# Patient Record
Sex: Female | Born: 1955
Health system: Southern US, Community
[De-identification: ages and names within clinical notes are randomized; demographics above are authoritative.]

## PROBLEM LIST (undated history)

## (undated) DIAGNOSIS — E785 Hyperlipidemia, unspecified: Secondary | ICD-10-CM

## (undated) DIAGNOSIS — E119 Type 2 diabetes mellitus without complications: Secondary | ICD-10-CM

## (undated) DIAGNOSIS — N289 Disorder of kidney and ureter, unspecified: Secondary | ICD-10-CM

## (undated) DIAGNOSIS — I10 Essential (primary) hypertension: Secondary | ICD-10-CM

## (undated) DIAGNOSIS — J449 Chronic obstructive pulmonary disease, unspecified: Secondary | ICD-10-CM

## (undated) DIAGNOSIS — E079 Disorder of thyroid, unspecified: Secondary | ICD-10-CM

## (undated) HISTORY — PX: TUBAL LIGATION: SHX77

## (undated) HISTORY — DX: Hyperlipidemia, unspecified: E78.5

## (undated) HISTORY — PX: FOOT SURGERY: SHX648

## (undated) HISTORY — PX: BREAST BIOPSY: SHX20

---

## 1983-11-09 HISTORY — PX: BREAST BIOPSY: SHX20

## 2000-10-11 ENCOUNTER — Emergency Department (HOSPITAL_COMMUNITY): Admission: EM | Admit: 2000-10-11 | Discharge: 2000-10-11 | Payer: Self-pay | Admitting: Emergency Medicine

## 2005-03-31 ENCOUNTER — Ambulatory Visit (HOSPITAL_COMMUNITY): Admission: RE | Admit: 2005-03-31 | Discharge: 2005-03-31 | Payer: Self-pay | Admitting: *Deleted

## 2005-03-31 ENCOUNTER — Ambulatory Visit (HOSPITAL_BASED_OUTPATIENT_CLINIC_OR_DEPARTMENT_OTHER): Admission: RE | Admit: 2005-03-31 | Discharge: 2005-03-31 | Payer: Self-pay | Admitting: *Deleted

## 2011-07-30 ENCOUNTER — Ambulatory Visit (HOSPITAL_COMMUNITY)
Admission: RE | Admit: 2011-07-30 | Discharge: 2011-07-30 | Disposition: A | Discharge status: Home / Self Care | Payer: Worker's Compensation | Source: Ambulatory Visit | Attending: Preventative Medicine | Admitting: Preventative Medicine

## 2011-07-30 DIAGNOSIS — M545 Low back pain, unspecified: Secondary | ICD-10-CM | POA: Insufficient documentation

## 2011-07-30 DIAGNOSIS — IMO0001 Reserved for inherently not codable concepts without codable children: Secondary | ICD-10-CM | POA: Insufficient documentation

## 2011-07-30 DIAGNOSIS — M79609 Pain in unspecified limb: Secondary | ICD-10-CM | POA: Insufficient documentation

## 2011-07-30 NOTE — Patient Instructions (Signed)
Body mechanics and HEP

## 2011-07-30 NOTE — Progress Notes (Signed)
Physical Therapy Evaluation  Patient Details  Name: Rebekah Ray MRN: [id number] Date of Birth: 1956-10-03  Today's Date: 07/30/2011 Time: 0105-0202 Time Calculation (min): 57 min Visit#: 1  of 3   Re-eval:   Assessment Diagnosis: low back pain Next MD Visit: 08/06/11 Prior Therapy: none  Past Medical History: No past medical history on file. Past Surgical History: No past surgical history on file.  Subjective Symptoms/Limitations Symptoms: Pt states that she injured herself at work picking up wet boxes of picture frame glass last Monday.  She had a gradual onset of pain but she is now experiencing pain down her right leg into her mid thigh level.  The patient states that she already had DJD in her back and would experience flare ups periodically.  She is experiencing pain more on the right than on the left.  The patien is doing exercises which are helping about 50 %.    How long can you sit comfortably?: The patient states that sitting doe not bother her. How long can you stand comfortably?: The patient states that she can stand for 45 minutes before her pain increases.  How long can you walk comfortably?: The patient states that walking is painful but not as bad as standing.   Pain Assessment Currently in Pain?: Yes Pain Score:   4 (best would be a 2 worst has been a 6.) Pain Location: Back Pain Orientation: Right Pain Type: Acute pain Pain Onset: In the past 7 days Pain Relieving Factors: exercise and medication. Effect of Pain on Daily Activities: increases. 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 ) Leisure: Hobbies-no  Cognition Cognition Overall Cognitive Status: Appears within functional limits for tasks assessed Arousal/Alertness: Awake/alert  Sensation/Coordination/Flexibility    Assessment RLE Strength Right Hip Flexion:  4/5 Right Hip Extension: 4/5 Right Hip ABduction: 4/5 Right Hip ADduction: 4/5 Right Knee Flexion: 4/5 Right Knee Extension: 4/5 Right Ankle Dorsiflexion: 4/5 LLE Strength Left Hip Flexion: 5/5 Left Hip Extension: 5/5 Left Hip ABduction: 5/5 Left Hip ADduction: 5/5 Left Knee Flexion: 5/5 Left Knee Extension: 5/5 Left Ankle Dorsiflexion: 5/5 Lumbar Assessment Lumbar Assessment: Exceptions to [facility] Lumbar AROM Lumbar Flexion:  (decreased 40 %) Lumbar Extension:  (decreased 70%) Lumbar - Right Side Bend: wfl Lumbar - Left Side Bend: wfl Lumbar - Right Rotation: wnl Lumbar - Left Rotation: decreased 40%  Mobility (including Balance)    Posture/Postural Control Posture/Postural Control:  (forward head, decreased kyphosis incresed lordosis)  Exercise/Treatments Stretches Active Hamstring Stretch: 3 reps;30 seconds Single Knee to Chest Stretch: 3 reps;20 seconds Lumbar Exercises   Stability Ab Set: 10 reps Machine Exercises    Modalities Modalities: Traction Traction Min (lbs): 50# decreased to 25# when we decreased the max but this still caused complaint therefore D/C Max (lbs):  70# caused "tingling in right thigh" decreased to max of 45# patient still complained of increased tingling D/C traction Hold Time: 45 Rest Time: 15  Physical Therapy Assessment and Plan PT Assessment and Plan Clinical Impression Statement: Radiculopathy.  Pt to be seen for one week.  Pt educated on body mechanics and exercise will need to reinforce this. Rehab Potential: Good PT Frequency: Min 3X/week PT Duration:  (1 wk) PT Treatment/Interventions: Therapeutic exercise;Patient/family education;Other (comment) (attempted traction) PT Plan: D/C traction, add e-stim with HMP as well as clam, bridge, heel squeeze and SLR    Goals Home Exercise  Program Pt will Perform Home Exercise Program: Independently PT Short Term Goals Time to Complete Short Term Goals: 2 weeks PT Short Term Goal 1:  no radicular pain PT Short Term Goal 2: Pain level to greater than a 3 PT Short Term Goal 3: ROM Wnl  Problem List There is no problem list on file for this patient.   PT - End of Session Activity Tolerance: Patient limited by pain General Behavior During Session: [facility] for tasks performed Cognition: [facility] for tasks performed   RUSSELL,CINDY 07/30/2011, 2:53 PM  Physician Documentation Your signature is required to indicate approval of the treatment plan as stated above.  Please sign and either send electronically or make a copy of this report for your files and return this physician signed original.   Please mark one 1.__approve of plan  2. ___approve of plan with the following conditions.   ______________________________                                                          _____________________ Physician Signature                                                                                                             Date

## 2011-08-04 ENCOUNTER — Ambulatory Visit (HOSPITAL_COMMUNITY)
Admission: RE | Admit: 2011-08-04 | Discharge: 2011-08-04 | Disposition: A | Discharge status: Home / Self Care | Payer: Worker's Compensation | Source: Ambulatory Visit | Attending: Preventative Medicine | Admitting: Preventative Medicine

## 2011-08-04 NOTE — Progress Notes (Signed)
Physical Therapy Treatment Patient Details  Name: Rebekah Ray MRN: [id number] Date of Birth: 1956-06-13  Today's Date: 08/04/2011 Time: 1025-1110 Time Calculation (min): 45 min Charges: TE: 30', estim 1 unit Visit#: 2  of 3   Re-eval:      Subjective: Symptoms/Limitations Symptoms: Pt reports that has increased spasm to her R low back into her bottom today.  She reports adherence with her HEP. Pain Assessment Pain Score:   2 Pain Location: Back Pain Orientation: Right Pain Type: Acute pain  Exercise/Treatments Stretches Single Knee to Chest Stretch: 3 reps;20 seconds;Limitations Single Knee to Chest Stretch Limitations: BLE Lower Trunk Rotation: 5 reps;10 seconds Prone on Elbows Stretch: 3 reps;30 seconds Lumbar Exercises   Stability Clam: Supine;5 reps;Limitations Clam Limitations: BLE Bridge: Supine;10 reps Ab Set: 10 reps;5 seconds Straight Leg Raise: Prone;10 reps;Limitations Straight Leg Raises Limitations: BLE Heel Squeeze: 5 reps;5 seconds Opposite Arm/Leg Raise: Prone;Right arm/Left leg;Left arm/Right leg;5 reps Machine Exercises    Modalities Modalities: Electrical Stimulation;Moist Heat Moist Heat Therapy Number Minutes Moist Heat: 15 Minutes (w/estim) Programme researcher, broadcasting/film/video Stimulation Location: R low back region Electrical Stimulation Action: IFES x15 minutes 80/150 Electrical Stimulation Goals: Pain  Physical Therapy Assessment and Plan PT Assessment and Plan Clinical Impression Statement: Pt tolerated all new activities well without an increase in pain today. Pt had overall decrease in pain after treatment today.  PT Plan: 1 more visit.  Review HEP     Goals    Problem List There is no problem list on file for this patient.   PT - End of Session Activity Tolerance: Patient tolerated treatment well  Lori-Ann Lindfors 08/04/2011, 12:04 PM

## 2011-08-05 ENCOUNTER — Ambulatory Visit (HOSPITAL_COMMUNITY)
Admission: RE | Admit: 2011-08-05 | Discharge: 2011-08-05 | Disposition: A | Discharge status: Home / Self Care | Payer: Worker's Compensation | Source: Ambulatory Visit | Attending: Physical Therapy | Admitting: Physical Therapy

## 2011-08-05 NOTE — Progress Notes (Addendum)
Physical Therapy Progress Note  Patient Details  Name: Rebekah Ray MRN: [id number] Date of Birth: January 12, 1956  Today's Date: 08/05/2011 Time: [id number] Charges: 32' TE   Visit#: 3  of 3   Re-eval:      Past Medical History: No past medical history on file. Past Surgical History: No past surgical history on file.  Subjective Symptoms/Limitations Symptoms: She reports that her overall pain continues to be able a 2/10.  She reports only mild radicular symptoms, but continues to have increased pain and spams to her upper thoracic to gluteal region.  Pain Assessment Currently in Pain?: Yes Pain Score:   2 Pain Location: Back  Assessment Lumbar AROM Lumbar Flexion: Decreased 40% Lumbar Extension: Decreased 70% Lumbar - Right Side Bend: WNL Lumbar - Left Side Bend: WNL Lumbar - Right Rotation: WNL Lumbar - Left Rotation: WNL  MMT: Lumbar flexion and extension: 4/5 Exercise/Treatments PT-Tech started with exercises at 13:45 until 14:15 Stretches Active Hamstring Stretch: 3 reps;30 seconds Single Knee to Chest Stretch: 3 reps;20 seconds Prone on Elbows Stretch: 3 reps;60 seconds Press Ups: Limitations Press Ups Limitations: 10 reps Quadruped Mid Back Stretch: 3 reps;30 seconds Lumbar Exercises  Heel Raises and Toe Raises 2x10 Stability Clam: Supine;10 reps;Limitations Clam Limitations: BLE Bent Knee Raise: Supine;10 reps Straight Leg Raises Limitations: BLE Heel Squeeze: Prone;5 reps;Limitations Heel Squeeze Limitations: 10 Opposite Arm/Leg Raise: Prone;Right arm/Left leg;Left arm/Right leg;10 reps Isometric hip flexion 5x5"   Physical Therapy Assessment and Plan PT Assessment and Plan Clinical Impression Statement: Pt was able to independently demonstrate HEP.  She has not improved in ROM, however her functional core strength has improved overall.   Pt continues to be limited by pain and spasm to her back and is limited at her end range of motion.   Goals Home  Exercise Program Pt will Perform Home Exercise Program: Independently PT Goal: Perform Home Exercise Program - Progress: Met PT Short Term Goals Time to Complete Short Term Goals: 2 weeks PT Short Term Goal 1: no radicular pain PT Short Term Goal 1 - Progress: Met PT Short Term Goal 2: Pain level to greater than a 3 PT Short Term Goal 2 - Progress: Met PT Short Term Goal 3: ROM Wnl  Problem List There is no problem list on file for this patient.   PT - End of Session Activity Tolerance: Patient tolerated treatment well   Shunna Mikaelian 08/05/2011, 1:50 PM  Physician Documentation Your signature is required to indicate approval of the treatment plan as stated above.  Please sign and either send electronically or make a copy of this report for your files and return this physician signed original.   Please mark one 1.__approve of plan  2. ___approve of plan with the following conditions.   ______________________________                                                          _____________________ Physician Signature  Date  

## 2011-08-06 DIAGNOSIS — M545 Low back pain, unspecified: Secondary | ICD-10-CM | POA: Insufficient documentation

## 2011-08-16 ENCOUNTER — Ambulatory Visit (HOSPITAL_COMMUNITY)
Admission: RE | Admit: 2011-08-16 | Discharge: 2011-08-16 | Disposition: A | Discharge status: Home / Self Care | Payer: Worker's Compensation | Source: Ambulatory Visit | Attending: Preventative Medicine | Admitting: Preventative Medicine

## 2011-08-16 DIAGNOSIS — M79609 Pain in unspecified limb: Secondary | ICD-10-CM | POA: Insufficient documentation

## 2011-08-16 DIAGNOSIS — IMO0001 Reserved for inherently not codable concepts without codable children: Secondary | ICD-10-CM | POA: Insufficient documentation

## 2011-08-16 DIAGNOSIS — M545 Low back pain, unspecified: Secondary | ICD-10-CM | POA: Insufficient documentation

## 2011-08-16 NOTE — Progress Notes (Signed)
Physical Therapy Treatment Patient Details  Name: Rebekah Ray MRN: [id number] Date of Birth: 12/12/1955  Today's Date: 08/16/2011 Time: [id number] Time Calculation (min): 44 min Visit#: 4  of 4   Re-eval:    Charges:  There ex 35; [place] 8'  Subjective: Symptoms/Limitations Symptoms: Patient states that after the e-stim she feels better when she gets up but when she stands the pain continues pain down her right leg.      Exercise/Treatments Stretches Single Knee to Chest Stretch: 2 reps;30 seconds  ham stretch 30 sec x 3. Prone on Elbows Stretch: 2 reps;60 seconds Press Ups: 5 reps Piriformis Stretch: 60 seconds;1 rep;Limitations (quadriped) Lumbar Exercises   Stability Bridge: 10 reps Bent Knee Raise: 10 reps Isometric Hip Flexion: 10 reps Heel Squeeze: 10 reps;Prone Single Arm Raise: 10 reps;Prone Leg Raise: 10 reps;Prone Opposite Arm/Leg Raise: 10 reps;Prone Machine Eercises   Modalities Modalities: Ultrasound Ultrasound Ultrasound Location: R sciatic notch; L5  paraspinal mm Ultrasound Parameters: 1.5 w/cmw 1.0Mhz Ultrasound Goals: Pain  Physical Therapy Assessment and Plan PT Assessment and Plan Clinical Impression Statement: Pt worked on prone stab exercises to improve core strength as well as stretching to increase ROM Rehab Potential: Good Clinical Impairments Affecting Rehab Potential: decreased strength an ROM PT Frequency: Min 3X/week PT Plan: See two more treatments then discharge.  Add T-band exercises next treatment.    Goals  decrease radicular symptoms, improve ROM and improve core strength.  Problem List Patient Active Problem List  Diagnoses  . Low back pain    PT - End of Session Activity Tolerance: Patient tolerated treatment well General Behavior During Session: [facility] for tasks performed Cognition: [facility] for tasks performed  Elmo Rio,CINDY 08/16/2011, 4:54 PM

## 2011-08-17 ENCOUNTER — Ambulatory Visit (HOSPITAL_COMMUNITY)
Admission: RE | Admit: 2011-08-17 | Discharge: 2011-08-17 | Disposition: A | Discharge status: Home / Self Care | Payer: Worker's Compensation | Source: Ambulatory Visit

## 2011-08-17 NOTE — Progress Notes (Signed)
Physical Therapy Treatment Patient Details  Name: SELENNE PESCHEL MRN: [id number] Date of Birth: 1956-06-21  Today's Date: 08/17/2011 Time: [id number] Time Calculation (min): 40 min Visit#: 5  of 6   Re-eval: 08/18/11  Charge: therex 32 min [place] 8 min  Subjective: Symptoms/Limitations Symptoms: Pain free right now, the [place] felt good following last session. Pain Assessment Currently in Pain?: No/denies  Objective:   Exercise/Treatments Lumbar Exercises Scapular Retraction: 10 reps;Standing;Theraband Theraband Level (Scapular Retraction): Level 3 (Green) Row: 10 reps;Standing;Theraband Theraband Level (Row): Level 3 (Green) Shoulder Extension: 10 reps;Standing;Theraband Theraband Level (Shoulder Extension): Level 3 (Green) Stability Clam: Supine;10 reps;5 seconds Clam Limitations: BLE Bridge: 10 reps Bent Knee Raise: 10 reps Straight Leg Raise: Supine;10 reps;3 seconds Straight Leg Raises Limitations: BLE  Modalities Modalities: Ultrasound Ultrasound Ultrasound Location: R sciatic notch; L5 paraspinal mm Ultrasound Parameters: 1.5 w/cmw 1.0Mhz Ultrasound Goals: Pain  Physical Therapy Assessment and Plan PT Assessment and Plan Clinical Impression Statement: Added tband therex for postural strengthening, min cueing for proper form/tech and abd iso for core strength/stability. PT Plan: See one more treatment then d/c to HEP, give pt green band/worksheet next session.    Goals    Problem List Patient Active Problem List  Diagnoses  . Low back pain    PT - End of Session Activity Tolerance: Patient tolerated treatment well General Behavior During Session: [facility] for tasks performed Cognition: [facility] for tasks performed  Aldona Lento 08/17/2011, 11:55 AM

## 2011-08-18 ENCOUNTER — Ambulatory Visit (HOSPITAL_COMMUNITY)
Admission: RE | Admit: 2011-08-18 | Discharge: 2011-08-18 | Disposition: A | Discharge status: Home / Self Care | Payer: Worker's Compensation | Source: Ambulatory Visit | Attending: Preventative Medicine | Admitting: Preventative Medicine

## 2011-08-18 NOTE — Progress Notes (Signed)
Physical Therapy Treatment Patient Details  Name: Rebekah Ray MRN: [id number] Date of Birth: 1956/03/25  Today's Date: 08/18/2011 Time: [id number] Time Calculation (min): 39 min Visit#: 6  of 6   Re-eval:      Subjective: Symptoms/Limitations Symptoms: Pt states that she had to unload wood out of boxes today at work and it got her back hurting again. Pain Assessment Currently in Pain?: Yes Pain Score:   2 Pain Location: Back Pain Orientation: Lower Pain Type: Acute pain  Precautions/Restrictions     Mobility (including Balance)       Exercise/Treatments Stretches Active Hamstring Stretch: 3 reps;30 seconds Single Knee to Chest Stretch: 3 reps;30 seconds Prone on Elbows Stretch: 60 seconds Lumbar Exercises   Stability Bridge: 10 reps Bent Knee Raise: 10 reps Heel Squeeze: 15 reps Single Arm Raise: 15 reps Leg Raise: 15 reps Opposite Arm/Leg Raise: 15 reps Machine Exercises    Modalities Modalities: Ultrasound Ultrasound Ultrasound Location: B L4/5  Ultrasound Parameters: 1.5 w/cm with 1.0Mhz  Ultrasound Goals: Pain  Physical Therapy Assessment and Plan PT Assessment and Plan Clinical Impression Statement: Improved technique with prone strengthening exercises; reviewed proper body mechanics for lifting . PT Plan: discharge patient as visits have expired.  Pt returns to MD on 08/19/11    Goals Home Exercise Program Pt will Perform Home Exercise Program: Independently PT Short Term Goals Time to Complete Short Term Goals: 2 weeks PT Short Term Goal 1 - Progress: Progressing toward goal (every once and a while I get a twinge in my leg.) PT Short Term Goal 2 - Progress: Met PT Short Term Goal 3 - Progress: Partly met  Problem List Patient Active Problem List  Diagnoses  . Low back pain    PT - End of Session Activity Tolerance: Patient tolerated treatment well General Behavior During Session: [facility] for tasks performed  RUSSELL,CINDY 08/18/2011,  4:58 PM

## 2011-08-18 NOTE — Patient Instructions (Signed)
Body mechanics

## 2014-01-14 ENCOUNTER — Encounter (HOSPITAL_COMMUNITY): Payer: Self-pay | Admitting: Emergency Medicine

## 2014-01-14 ENCOUNTER — Inpatient Hospital Stay (HOSPITAL_COMMUNITY)
Admission: EM | Admit: 2014-01-14 | Discharge: 2014-01-18 | DRG: 190 | Disposition: A | Discharge status: Home / Self Care | Payer: BC Managed Care – PPO | Attending: Internal Medicine | Admitting: Internal Medicine

## 2014-01-14 ENCOUNTER — Emergency Department (HOSPITAL_COMMUNITY): Payer: BC Managed Care – PPO

## 2014-01-14 DIAGNOSIS — Z720 Tobacco use: Secondary | ICD-10-CM | POA: Diagnosis present

## 2014-01-14 DIAGNOSIS — J449 Chronic obstructive pulmonary disease, unspecified: Secondary | ICD-10-CM

## 2014-01-14 DIAGNOSIS — I5030 Unspecified diastolic (congestive) heart failure: Secondary | ICD-10-CM | POA: Diagnosis present

## 2014-01-14 DIAGNOSIS — Z23 Encounter for immunization: Secondary | ICD-10-CM

## 2014-01-14 DIAGNOSIS — F17200 Nicotine dependence, unspecified, uncomplicated: Secondary | ICD-10-CM | POA: Diagnosis present

## 2014-01-14 DIAGNOSIS — I509 Heart failure, unspecified: Secondary | ICD-10-CM | POA: Diagnosis present

## 2014-01-14 DIAGNOSIS — R7989 Other specified abnormal findings of blood chemistry: Secondary | ICD-10-CM | POA: Diagnosis present

## 2014-01-14 DIAGNOSIS — I10 Essential (primary) hypertension: Secondary | ICD-10-CM | POA: Diagnosis present

## 2014-01-14 DIAGNOSIS — E119 Type 2 diabetes mellitus without complications: Secondary | ICD-10-CM | POA: Diagnosis present

## 2014-01-14 DIAGNOSIS — J9600 Acute respiratory failure, unspecified whether with hypoxia or hypercapnia: Secondary | ICD-10-CM | POA: Diagnosis present

## 2014-01-14 DIAGNOSIS — I129 Hypertensive chronic kidney disease with stage 1 through stage 4 chronic kidney disease, or unspecified chronic kidney disease: Secondary | ICD-10-CM | POA: Diagnosis present

## 2014-01-14 DIAGNOSIS — J209 Acute bronchitis, unspecified: Principal | ICD-10-CM | POA: Diagnosis present

## 2014-01-14 DIAGNOSIS — N179 Acute kidney failure, unspecified: Secondary | ICD-10-CM | POA: Diagnosis present

## 2014-01-14 DIAGNOSIS — Z9119 Patient's noncompliance with other medical treatment and regimen: Secondary | ICD-10-CM

## 2014-01-14 DIAGNOSIS — R9431 Abnormal electrocardiogram [ECG] [EKG]: Secondary | ICD-10-CM | POA: Diagnosis present

## 2014-01-14 DIAGNOSIS — N189 Chronic kidney disease, unspecified: Secondary | ICD-10-CM | POA: Diagnosis present

## 2014-01-14 DIAGNOSIS — E876 Hypokalemia: Secondary | ICD-10-CM | POA: Diagnosis present

## 2014-01-14 DIAGNOSIS — J440 Chronic obstructive pulmonary disease with acute lower respiratory infection: Principal | ICD-10-CM | POA: Diagnosis present

## 2014-01-14 DIAGNOSIS — I160 Hypertensive urgency: Secondary | ICD-10-CM | POA: Diagnosis present

## 2014-01-14 DIAGNOSIS — Z91199 Patient's noncompliance with other medical treatment and regimen due to unspecified reason: Secondary | ICD-10-CM

## 2014-01-14 HISTORY — DX: Type 2 diabetes mellitus without complications: E11.9

## 2014-01-14 HISTORY — DX: Essential (primary) hypertension: I10

## 2014-01-14 HISTORY — DX: Chronic obstructive pulmonary disease, unspecified: J44.9

## 2014-01-14 LAB — CBC WITH DIFFERENTIAL/PLATELET
Basophils Absolute: 0 10*3/uL (ref 0.0–0.1)
Basophils Relative: 0 % (ref 0–1)
Eosinophils Absolute: 0.1 10*3/uL (ref 0.0–0.7)
Eosinophils Relative: 2 % (ref 0–5)
HEMATOCRIT: 42.8 % (ref 36.0–46.0)
Hemoglobin: 14.6 g/dL (ref 12.0–15.0)
LYMPHS ABS: 2 10*3/uL (ref 0.7–4.0)
LYMPHS PCT: 37 % (ref 12–46)
MCH: 28.9 pg (ref 26.0–34.0)
MCHC: 34.1 g/dL (ref 30.0–36.0)
MCV: 84.8 fL (ref 78.0–100.0)
MONOS PCT: 7 % (ref 3–12)
Monocytes Absolute: 0.4 10*3/uL (ref 0.1–1.0)
NEUTROS ABS: 3 10*3/uL (ref 1.7–7.7)
Neutrophils Relative %: 55 % (ref 43–77)
Platelets: 237 10*3/uL (ref 150–400)
RBC: 5.05 MIL/uL (ref 3.87–5.11)
RDW: 13.6 % (ref 11.5–15.5)
WBC: 5.5 10*3/uL (ref 4.0–10.5)

## 2014-01-14 LAB — BASIC METABOLIC PANEL
BUN: 11 mg/dL (ref 6–23)
CHLORIDE: 105 meq/L (ref 96–112)
CO2: 24 meq/L (ref 19–32)
CREATININE: 1.1 mg/dL (ref 0.50–1.10)
Calcium: 8.9 mg/dL (ref 8.4–10.5)
GFR calc Af Amer: 63 mL/min — ABNORMAL LOW (ref >90)
GFR calc non Af Amer: 54 mL/min — ABNORMAL LOW (ref >90)
Glucose, Bld: 116 mg/dL — ABNORMAL HIGH (ref 70–99)
POTASSIUM: 3.3 meq/L — AB (ref 3.7–5.3)
Sodium: 144 mEq/L (ref 137–147)

## 2014-01-14 LAB — TROPONIN I
Troponin I: <0.3 ng/mL (ref <0.30)
Troponin I: <0.3 ng/mL (ref <0.30)

## 2014-01-14 LAB — PRO B NATRIURETIC PEPTIDE: Pro B Natriuretic peptide (BNP): 1147 pg/mL — ABNORMAL HIGH (ref 0–125)

## 2014-01-14 LAB — MAGNESIUM: MAGNESIUM: 1.9 mg/dL (ref 1.5–2.5)

## 2014-01-14 MED ORDER — HYDRALAZINE HCL 25 MG PO TABS
25.0000 mg | ORAL_TABLET | Freq: Four times a day (QID) | ORAL | Status: DC
  Administered 2014-01-14 – 2014-01-15 (×4): 25 mg via ORAL
  Filled 2014-01-14 (×3): qty 1

## 2014-01-14 MED ORDER — POTASSIUM CHLORIDE CRYS ER 20 MEQ PO TBCR
40.0000 meq | EXTENDED_RELEASE_TABLET | ORAL | Status: AC
  Administered 2014-01-14 – 2014-01-15 (×2): 40 meq via ORAL
  Filled 2014-01-14 (×2): qty 2

## 2014-01-14 MED ORDER — IPRATROPIUM BROMIDE 0.02 % IN SOLN
0.5000 mg | Freq: Four times a day (QID) | RESPIRATORY_TRACT | Status: DC
  Administered 2014-01-14: 0.5 mg via RESPIRATORY_TRACT
  Filled 2014-01-14: qty 2.5

## 2014-01-14 MED ORDER — IPRATROPIUM-ALBUTEROL 0.5-2.5 (3) MG/3ML IN SOLN: 3.0000 mL | RESPIRATORY_TRACT | Status: DC

## 2014-01-14 MED ORDER — METHYLPREDNISOLONE SODIUM SUCC 125 MG IJ SOLR
60.0000 mg | Freq: Four times a day (QID) | INTRAMUSCULAR | Status: DC
Start: 2014-01-14 — End: 2014-01-16
  Administered 2014-01-14: 23:00:00 via INTRAVENOUS
  Administered 2014-01-15 – 2014-01-16 (×7): 60 mg via INTRAVENOUS
  Filled 2014-01-14 (×8): qty 2

## 2014-01-14 MED ORDER — PNEUMOCOCCAL VAC POLYVALENT 25 MCG/0.5ML IJ INJ
0.5000 mL | INJECTION | INTRAMUSCULAR | Status: AC
  Administered 2014-01-15: 0.5 mL via INTRAMUSCULAR
  Filled 2014-01-14: qty 0.5

## 2014-01-14 MED ORDER — SODIUM CHLORIDE 0.9 % IJ SOLN
3.0000 mL | INTRAMUSCULAR | Status: DC | PRN
  Administered 2014-01-15: 3 mL via INTRAVENOUS

## 2014-01-14 MED ORDER — ONDANSETRON HCL 4 MG/2ML IJ SOLN: 4.0000 mg | Freq: Four times a day (QID) | INTRAMUSCULAR | Status: DC | PRN

## 2014-01-14 MED ORDER — SODIUM CHLORIDE 0.9 % IJ SOLN
3.0000 mL | Freq: Two times a day (BID) | INTRAMUSCULAR | Status: DC
  Administered 2014-01-14 – 2014-01-18 (×8): 3 mL via INTRAVENOUS

## 2014-01-14 MED ORDER — SODIUM CHLORIDE 0.9 % IV SOLN
250.0000 mL | INTRAVENOUS | Status: DC | PRN
  Administered 2014-01-15: 250 mL via INTRAVENOUS

## 2014-01-14 MED ORDER — ONDANSETRON HCL 4 MG PO TABS: 4.0000 mg | ORAL_TABLET | Freq: Four times a day (QID) | ORAL | Status: DC | PRN

## 2014-01-14 MED ORDER — METHYLPREDNISOLONE SODIUM SUCC 125 MG IJ SOLR
125.0000 mg | Freq: Once | INTRAMUSCULAR | Status: AC
  Administered 2014-01-14: 125 mg via INTRAVENOUS
  Filled 2014-01-14: qty 2

## 2014-01-14 MED ORDER — IPRATROPIUM-ALBUTEROL 0.5-2.5 (3) MG/3ML IN SOLN
3.0000 mL | Freq: Once | RESPIRATORY_TRACT | Status: AC
  Administered 2014-01-14: 3 mL via RESPIRATORY_TRACT
  Filled 2014-01-14: qty 3

## 2014-01-14 MED ORDER — BIOTENE DRY MOUTH MT LIQD
15.0000 mL | Freq: Two times a day (BID) | OROMUCOSAL | Status: DC
  Administered 2014-01-14: 15 mL via OROMUCOSAL

## 2014-01-14 MED ORDER — ALBUTEROL SULFATE (2.5 MG/3ML) 0.083% IN NEBU: 2.5000 mg | INHALATION_SOLUTION | RESPIRATORY_TRACT | Status: DC | PRN

## 2014-01-14 MED ORDER — ALBUTEROL SULFATE (2.5 MG/3ML) 0.083% IN NEBU
2.5000 mg | INHALATION_SOLUTION | Freq: Four times a day (QID) | RESPIRATORY_TRACT | Status: DC
  Administered 2014-01-14: 2.5 mg via RESPIRATORY_TRACT
  Filled 2014-01-14: qty 3

## 2014-01-14 MED ORDER — AMLODIPINE BESYLATE 5 MG PO TABS
5.0000 mg | ORAL_TABLET | Freq: Every day | ORAL | Status: DC
  Administered 2014-01-14 – 2014-01-15 (×2): 5 mg via ORAL
  Filled 2014-01-14 (×2): qty 1

## 2014-01-14 MED ORDER — DM-GUAIFENESIN ER 30-600 MG PO TB12
1.0000 | ORAL_TABLET | Freq: Two times a day (BID) | ORAL | Status: DC
  Administered 2014-01-14 – 2014-01-18 (×8): 1 via ORAL
  Filled 2014-01-14 (×8): qty 1

## 2014-01-14 MED ORDER — IPRATROPIUM-ALBUTEROL 0.5-2.5 (3) MG/3ML IN SOLN
3.0000 mL | Freq: Four times a day (QID) | RESPIRATORY_TRACT | Status: DC
  Administered 2014-01-15 – 2014-01-18 (×12): 3 mL via RESPIRATORY_TRACT
  Filled 2014-01-14 (×12): qty 3

## 2014-01-14 MED ORDER — ENOXAPARIN SODIUM 40 MG/0.4ML ~~LOC~~ SOLN
40.0000 mg | SUBCUTANEOUS | Status: DC
  Administered 2014-01-14 – 2014-01-15 (×2): 40 mg via SUBCUTANEOUS
  Filled 2014-01-14 (×2): qty 0.4

## 2014-01-14 MED ORDER — ALBUTEROL SULFATE (2.5 MG/3ML) 0.083% IN NEBU
5.0000 mg | INHALATION_SOLUTION | Freq: Once | RESPIRATORY_TRACT | Status: AC
  Administered 2014-01-14: 5 mg via RESPIRATORY_TRACT
  Filled 2014-01-14: qty 6

## 2014-01-14 MED ORDER — CLONIDINE HCL 0.2 MG PO TABS
0.2000 mg | ORAL_TABLET | Freq: Three times a day (TID) | ORAL | Status: DC | PRN
  Administered 2014-01-15: 0.2 mg via ORAL
  Filled 2014-01-14: qty 1

## 2014-01-14 MED ORDER — INSULIN ASPART 100 UNIT/ML ~~LOC~~ SOLN: 0.0000 [IU] | Freq: Three times a day (TID) | SUBCUTANEOUS | Status: DC

## 2014-01-14 MED ORDER — FUROSEMIDE 10 MG/ML IJ SOLN
40.0000 mg | Freq: Once | INTRAMUSCULAR | Status: AC
Start: 2014-01-14 — End: 2014-01-14
  Administered 2014-01-14: 40 mg via INTRAVENOUS
  Filled 2014-01-14: qty 4

## 2014-01-14 NOTE — ED Notes (Signed)
Pt was sent here from Urgent Care for SOB which began Thursday. Had breathing tx at [facility] with no relief.

## 2014-01-14 NOTE — H&P (Addendum)
PCP:   Laurel Dimmer, MD   Chief Complaint:  Shortness of breath  HPI:  58 year old female who  has a past medical history of Hypertension; Diabetes mellitus without complication; and COPD (chronic obstructive pulmonary disease). presented to the ED with chief complaint of shortness of breath since last Thursday, patient has been coughing along with worsening shortness of breath on exertion. Patient also was found to have elevated blood pressure in the ED. Patient is noncompliant with her medications and has not seen her primary care for past couple of years. She does smoke cigarettes one pack per day but has no diagnoses of COPD as per patient. She does not take any inhalers at home. Patient has been trying over-the-counter cough medications with not much relief. In the ED her blood pressure is elevated 202/100, chest x-ray shows pulmonary vascular congestion and cardiomegaly but no frank pulmonary edema. BNP is elevated to 1100. She did receive nebulizer treatments in the ED with not much relief. She also got one dose of Lasix 40 mg IV x1. Patient denies any chest pain, no nausea vomiting or diarrhea. She also admits to having orthopnea. Patient has history of diabetes mellitus but has not been taking medications.  Allergies:  No Known Allergies    Past Medical History  Diagnosis Date  . Hypertension   . Diabetes mellitus without complication   . COPD (chronic obstructive pulmonary disease)     Past Surgical History  Procedure Laterality Date  . Cesarean section    . Tubal ligation    . Breast biopsy      Prior to Admission medications   Medication Sig Start Date End Date Taking? Authorizing Provider  guaifenesin (ROBITUSSIN) 100 MG/5ML syrup Take 200 mg by mouth 3 (three) times daily as needed for cough.   Yes Historical Provider, MD    Social History:  reports that she has been smoking Cigarettes.  She has been smoking about 0.00 packs per day. She does not have any  smokeless tobacco history on file. She reports that she does not drink alcohol. Her drug history is not on file.   All the positives are listed in BOLD  Review of Systems:  HEENT: Headache, blurred vision, runny nose, sore throat Neck: Hypothyroidism, hyperthyroidism,,lymphadenopathy Chest : Shortness of breath, history of COPD, Asthma Heart : Chest pain, history of coronary arterey disease GI:  Nausea, vomiting, diarrhea, constipation, GERD GU: Dysuria, urgency, frequency of urination, hematuria Neuro: Stroke, seizures, syncope Psych: Depression, anxiety, hallucinations   Physical Exam: Blood pressure 202/87, pulse 88, temperature 97.6 F (36.4 C), temperature source Oral, resp. rate 17, height 4' 11" (1.499 m), weight 83.915 kg (185 lb), SpO2 94.00%. Constitutional:   Patient is a well-developed and well-nourished female in no acute distress and cooperative with exam. Head: Normocephalic and atraumatic Mouth: Mucus membranes moist Eyes: PERRL, EOMI, conjunctivae normal Neck: Supple, No Thyromegaly Cardiovascular: RRR, S1 normal, S2 normal Pulmonary/Chest: Bilateral rhonchi Abdominal: Soft. Non-tender, non-distended, bowel sounds are normal, no masses, organomegaly, or guarding present.  Neurological: A&O x3, Strenght is normal and symmetric bilaterally, cranial nerve II-XII are grossly intact, no focal motor deficit, sensory intact to light touch bilaterally.  Extremities : Trace edema of the lower extremities   Labs on Admission:  Results for orders placed during the hospital encounter of 01/14/14 (from the past 48 hour(s))  CBC WITH DIFFERENTIAL     Status: None   Collection Time    01/14/14  2:56 PM  Result Value Ref Range   WBC 5.5  4.0 - 10.5 K/uL   RBC 5.05  3.87 - 5.11 MIL/uL   Hemoglobin 14.6  12.0 - 15.0 g/dL   HCT 42.8  36.0 - 46.0 %   MCV 84.8  78.0 - 100.0 fL   MCH 28.9  26.0 - 34.0 pg   MCHC 34.1  30.0 - 36.0 g/dL   RDW 13.6  11.5 - 15.5 %   Platelets  237  150 - 400 K/uL   Neutrophils Relative % 55  43 - 77 %   Neutro Abs 3.0  1.7 - 7.7 K/uL   Lymphocytes Relative 37  12 - 46 %   Lymphs Abs 2.0  0.7 - 4.0 K/uL   Monocytes Relative 7  3 - 12 %   Monocytes Absolute 0.4  0.1 - 1.0 K/uL   Eosinophils Relative 2  0 - 5 %   Eosinophils Absolute 0.1  0.0 - 0.7 K/uL   Basophils Relative 0  0 - 1 %   Basophils Absolute 0.0  0.0 - 0.1 K/uL  BASIC METABOLIC PANEL     Status: Abnormal   Collection Time    01/14/14  2:56 PM      Result Value Ref Range   Sodium 144  137 - 147 mEq/L   Potassium 3.3 (*) 3.7 - 5.3 mEq/L   Chloride 105  96 - 112 mEq/L   CO2 24  19 - 32 mEq/L   Glucose, Bld 116 (*) 70 - 99 mg/dL   BUN 11  6 - 23 mg/dL   Creatinine, Ser 1.10  0.50 - 1.10 mg/dL   Calcium 8.9  8.4 - 10.5 mg/dL   GFR calc non Af Amer 54 (*) >90 mL/min   GFR calc Af Amer 63 (*) >90 mL/min   Comment: (NOTE)     The eGFR has been calculated using the CKD EPI equation.     This calculation has not been validated in all clinical situations.     eGFR's persistently <90 mL/min signify possible Chronic Kidney     Disease.  TROPONIN I     Status: None   Collection Time    01/14/14  2:56 PM      Result Value Ref Range   Troponin I <0.30  <0.30 ng/mL   Comment:            Due to the release kinetics of cTnI,     a negative result within the first hours     of the onset of symptoms does not rule out     myocardial infarction with certainty.     If myocardial infarction is still suspected,     repeat the test at appropriate intervals.  PRO B NATRIURETIC PEPTIDE     Status: Abnormal   Collection Time    01/14/14  2:56 PM      Result Value Ref Range   Pro B Natriuretic peptide (BNP) 1147.0 (*) 0 - 125 pg/mL    Radiological Exams on Admission: Dg Chest 2 View  01/14/2014   CLINICAL DATA:  Cough and shortness of breath.  EXAM: CHEST  2 VIEW  COMPARISON:  No priors.  FINDINGS: Lung volumes are normal. No consolidative airspace disease. No pleural  effusions. No evidence of pulmonary edema. Mild cephalization of the pulmonary vasculature. Mild cardiomegaly. Upper mediastinal contours are within normal limits. Atherosclerosis in the thoracic aorta.  IMPRESSION: 1. Mild cardiomegaly with pulmonary venous congestion, but no frank   pulmonary edema. 2. Atherosclerosis.   Electronically Signed   By: Daniel  Entrikin M.D.   On: 01/14/2014 15:06    Assessment/Plan Principal Problem:   Acute bronchitis Active Problems:   CHF (congestive heart failure)   DM (diabetes mellitus)   Hypertensive urgency  Acute bronchitis At this time patient is presenting with picture of acute bronchitis, was started on salmeterol 60 mg IV every 6 hours DuoNeb nebulizers every 6 hours with albuterol every 2 hours when necessary. We'll also start Mucinex DM one tablet twice a day.  Patient has been afebrile, we'll not start antibiotics at this time.  Hypertensive urgency Patient is presented with hypertensive urgency, we'll start her on hydralazine 25 mg every 6 hours along with amlodipine 5 mg by mouth daily. We'll also give Catapres 0.2 mg by mouth q. 8 hours when necessary for BP greater than 160/100. I will also obtain 2-D echocardiogram to rule out underlying dust dysfunction as patient has elevated BNP which could be contributing to patient's dyspnea at this time.  Diabetes mellitus Patient has history of diabetes mellitus, will start her on sliding-scale insulin. Will also obtain hemoglobin A1c.  Hypokalemia Will replace potassium and check BMP in the morning.  Qtc prolongation Patient has QT prolongation, will replace the potassium and check magnesium. Patient will be monitored under telemetry.  DVT prophylaxis Lovenox  Code status: Patient is full code  Family discussion: Discussed with patient's son at bedside   Time Spent on Admission: 70 minutes  LAMA,GAGAN S [facility] Pager: [phone] 01/14/2014, 6:30 PM  If 7PM-7AM, please contact  night-coverage  [email or web address]  Password [email or web address]   

## 2014-01-14 NOTE — ED Provider Notes (Signed)
CSN: [id number]     Arrival date & time 01/14/14  1349 History  This chart was scribed for Rebekah Greek, MD,  by Stacy Gardner, ED Scribe. The patient was seen in room APA18/APA18 and the patient's care was started at 2:25 PM.   None    Chief Complaint  Patient presents with  . Shortness of Breath     (Consider location/radiation/quality/duration/timing/severity/associated sxs/prior Treatment) Patient is a 58 y.o. female presenting with shortness of breath. The history is provided by medical records and the patient. No language interpreter was used.  Shortness of Breath Severity:  Moderate Onset quality:  Sudden Duration:  4 hours Timing:  Constant Progression:  Unchanged Chronicity:  Recurrent Relieved by:  Nothing Worsened by:  Nothing tried Ineffective treatments:  Inhaler Associated symptoms: fever and wheezing   Associated symptoms: no cough   Risk factors: tobacco use   Risk factors: no recent alcohol use    HPI Comments: Rebekah Ray is a 58 y.o. female with COPD who presents to the Emergency Department complaining of SOB for the past four days. Pt was advised to come to the ED after her visit to Urgent Care. Pt had a breathing treatment at UC with no relief. Pt had a fever of 99.7. Denies productive cough. Denies taking medications for HBP. She has a hx of frequent bronchitis. Nothing seems to resolve her symptoms.  Past Medical History  Diagnosis Date  . Hypertension   . Diabetes mellitus without complication   . COPD (chronic obstructive pulmonary disease)    Past Surgical History  Procedure Laterality Date  . Cesarean section    . Tubal ligation    . Breast biopsy     No family history on file. History  Substance Use Topics  . Smoking status: Current Every Day Smoker    Types: Cigarettes  . Smokeless tobacco: Not on file  . Alcohol Use: No   OB History   Grav Para Term Preterm Abortions TAB SAB Ect Mult Living                 Review of  Systems  Constitutional: Positive for fever.  Respiratory: Positive for shortness of breath and wheezing. Negative for cough.   All other systems reviewed and are negative.      Allergies  Review of patient's allergies indicates no known allergies.  Home Medications   Current Outpatient Rx  Name  Route  Sig  Dispense  Refill  . guaifenesin (ROBITUSSIN) 100 MG/5ML syrup   Oral   Take 200 mg by mouth 3 (three) times daily as needed for cough.          BP 204/93  Pulse 66  Temp(Src) 97.6 F (36.4 C) (Oral)  Resp 25  Ht 4\' 11"  (1.499 m)  Wt 185 lb (83.915 kg)  BMI 37.35 kg/m2  SpO2 95% Physical Exam  Constitutional: She is oriented to person, place, and time. She appears well-developed and well-nourished. No distress.  HENT:  Head: Normocephalic and atraumatic.  Right Ear: Hearing normal.  Left Ear: Hearing normal.  Nose: Nose normal.  Mouth/Throat: Oropharynx is clear and moist and mucous membranes are normal.  Eyes: Conjunctivae and EOM are normal. Pupils are equal, round, and reactive to light.  Neck: Normal range of motion. Neck supple.  Cardiovascular: Regular rhythm, S1 normal and S2 normal.  Exam reveals no gallop and no friction rub.   No murmur heard. Pulmonary/Chest: Tachypnea noted. No respiratory distress. She has wheezes (diffuse).  She has rhonchi. She exhibits no tenderness.  Abdominal: Soft. Normal appearance and bowel sounds are normal. There is no hepatosplenomegaly. There is no tenderness. There is no rebound, no guarding, no tenderness at McBurney's point and negative Murphy's sign. No hernia.  Musculoskeletal: Normal range of motion.  Neurological: She is alert and oriented to person, place, and time. She has normal strength. No cranial nerve deficit or sensory deficit. Coordination normal. GCS eye subscore is 4. GCS verbal subscore is 5. GCS motor subscore is 6.  Skin: Skin is warm, dry and intact. No rash noted. No cyanosis.  Psychiatric: She has a  normal mood and affect. Her speech is normal and behavior is normal. Thought content normal.    ED Course  Procedures (including critical care time) DIAGNOSTIC STUDIES: Oxygen Saturation is 95% on room air, normal by my interpretation.    COORDINATION OF CARE:  2:29 PM Discussed course of care with pt which includes EKG, chest x-ray, cardiac monitoring and Duoneb . Pt understands and agrees.   Labs Review Labs Reviewed - No data to display Imaging Review No results found.   EKG Interpretation None      MDM   Final diagnoses:  None    Patient presents to the ER from urgent care for evaluation of shortness of breath. Patient was treated with albuterol nebulizer urgent care without improvement. She does have a smoking history with COPD. Upon arrival she significant wheezing and scattered rhonchi throughout all lung fields. This did not improve with treatment for bronchospasm. Chest x-ray showed cardiomegaly and therefore troponin and BNP were performed. She also has markedly elevated blood pressure upon arrival here in the ER. She is not currently taking any medicines for her hypertension.  BNP is above 1100. It is therefore concerned for congestive heart failure as a cause of the patient's symptoms, in addition to COPD. Patient will be administered Lasix here in the ER. This may help with her elevated blood pressure. Patient will be admitted to the hospitalist service.  I personally performed the services described in this documentation, which was scribed in my presence. The recorded information has been reviewed and is accurate.   Rebekah Greek, MD 01/16/14 1530

## 2014-01-15 DIAGNOSIS — F17200 Nicotine dependence, unspecified, uncomplicated: Secondary | ICD-10-CM

## 2014-01-15 DIAGNOSIS — Z9119 Patient's noncompliance with other medical treatment and regimen: Secondary | ICD-10-CM

## 2014-01-15 DIAGNOSIS — Z720 Tobacco use: Secondary | ICD-10-CM | POA: Diagnosis present

## 2014-01-15 DIAGNOSIS — I10 Essential (primary) hypertension: Secondary | ICD-10-CM | POA: Diagnosis present

## 2014-01-15 DIAGNOSIS — Z91199 Patient's noncompliance with other medical treatment and regimen due to unspecified reason: Secondary | ICD-10-CM

## 2014-01-15 DIAGNOSIS — I517 Cardiomegaly: Secondary | ICD-10-CM

## 2014-01-15 LAB — CBC
HCT: 41.9 % (ref 36.0–46.0)
Hemoglobin: 14 g/dL (ref 12.0–15.0)
MCH: 28.3 pg (ref 26.0–34.0)
MCHC: 33.4 g/dL (ref 30.0–36.0)
MCV: 84.6 fL (ref 78.0–100.0)
Platelets: 234 10*3/uL (ref 150–400)
RBC: 4.95 MIL/uL (ref 3.87–5.11)
RDW: 13.7 % (ref 11.5–15.5)
WBC: 4.7 10*3/uL (ref 4.0–10.5)

## 2014-01-15 LAB — HEMOGLOBIN A1C
Hgb A1c MFr Bld: 6.5 % — ABNORMAL HIGH (ref <5.7)
Mean Plasma Glucose: 140 mg/dL — ABNORMAL HIGH (ref <117)

## 2014-01-15 LAB — TROPONIN I
Troponin I: <0.3 ng/mL (ref <0.30)
Troponin I: <0.3 ng/mL (ref <0.30)

## 2014-01-15 LAB — COMPREHENSIVE METABOLIC PANEL
ALT: 15 U/L (ref 0–35)
AST: 18 U/L (ref 0–37)
Albumin: 3.8 g/dL (ref 3.5–5.2)
Alkaline Phosphatase: 110 U/L (ref 39–117)
BILIRUBIN TOTAL: 0.5 mg/dL (ref 0.3–1.2)
BUN: 15 mg/dL (ref 6–23)
CHLORIDE: 95 meq/L — AB (ref 96–112)
CO2: 16 meq/L — AB (ref 19–32)
Calcium: 8.7 mg/dL (ref 8.4–10.5)
Creatinine, Ser: 1.27 mg/dL — ABNORMAL HIGH (ref 0.50–1.10)
GFR, EST AFRICAN AMERICAN: 53 mL/min — AB (ref >90)
GFR, EST NON AFRICAN AMERICAN: 46 mL/min — AB (ref >90)
Glucose, Bld: 371 mg/dL — ABNORMAL HIGH (ref 70–99)
Potassium: 3.6 mEq/L — ABNORMAL LOW (ref 3.7–5.3)
SODIUM: 137 meq/L (ref 137–147)
Total Protein: 8.3 g/dL (ref 6.0–8.3)

## 2014-01-15 LAB — GLUCOSE, CAPILLARY
GLUCOSE-CAPILLARY: 331 mg/dL — AB (ref 70–99)
GLUCOSE-CAPILLARY: 222 mg/dL — AB (ref 70–99)
GLUCOSE-CAPILLARY: 250 mg/dL — AB (ref 70–99)
Glucose-Capillary: 347 mg/dL — ABNORMAL HIGH (ref 70–99)

## 2014-01-15 MED ORDER — AMLODIPINE BESYLATE 5 MG PO TABS
10.0000 mg | ORAL_TABLET | Freq: Every day | ORAL | Status: DC
  Administered 2014-01-16 – 2014-01-18 (×3): 10 mg via ORAL
  Filled 2014-01-15 (×3): qty 2

## 2014-01-15 MED ORDER — INSULIN ASPART 100 UNIT/ML ~~LOC~~ SOLN
0.0000 [IU] | Freq: Every day | SUBCUTANEOUS | Status: DC
  Administered 2014-01-15: 2 [IU] via SUBCUTANEOUS
  Administered 2014-01-16 – 2014-01-17 (×2): 3 [IU] via SUBCUTANEOUS

## 2014-01-15 MED ORDER — LEVOFLOXACIN IN D5W 500 MG/100ML IV SOLN
500.0000 mg | INTRAVENOUS | Status: DC
Start: 2014-01-15 — End: 2014-01-16
  Administered 2014-01-15: 500 mg via INTRAVENOUS
  Filled 2014-01-15 (×2): qty 100

## 2014-01-15 MED ORDER — HYDRALAZINE HCL 20 MG/ML IJ SOLN: 5.0000 mg | INTRAMUSCULAR | Status: DC | PRN

## 2014-01-15 MED ORDER — INSULIN ASPART 100 UNIT/ML ~~LOC~~ SOLN
0.0000 [IU] | Freq: Three times a day (TID) | SUBCUTANEOUS | Status: DC
Start: 2014-01-15 — End: 2014-01-18
  Administered 2014-01-15: 15 [IU] via SUBCUTANEOUS
  Administered 2014-01-15: 7 [IU] via SUBCUTANEOUS
  Administered 2014-01-16 (×2): 15 [IU] via SUBCUTANEOUS
  Administered 2014-01-16: 7 [IU] via SUBCUTANEOUS
  Administered 2014-01-17: 11 [IU] via SUBCUTANEOUS
  Administered 2014-01-17: 4 [IU] via SUBCUTANEOUS
  Administered 2014-01-17: 15 [IU] via SUBCUTANEOUS
  Administered 2014-01-18: 7 [IU] via SUBCUTANEOUS

## 2014-01-15 MED ORDER — FAMOTIDINE 20 MG PO TABS
20.0000 mg | ORAL_TABLET | Freq: Every day | ORAL | Status: DC
  Administered 2014-01-15 – 2014-01-18 (×4): 20 mg via ORAL
  Filled 2014-01-15 (×4): qty 1

## 2014-01-15 MED ORDER — METFORMIN HCL 500 MG PO TABS
500.0000 mg | ORAL_TABLET | Freq: Two times a day (BID) | ORAL | Status: DC
  Administered 2014-01-15 – 2014-01-16 (×2): 500 mg via ORAL
  Filled 2014-01-15 (×2): qty 1

## 2014-01-15 MED ORDER — INSULIN DETEMIR 100 UNIT/ML ~~LOC~~ SOLN
10.0000 [IU] | Freq: Every day | SUBCUTANEOUS | Status: DC
  Administered 2014-01-15 – 2014-01-16 (×2): 10 [IU] via SUBCUTANEOUS
  Filled 2014-01-15 (×2): qty 0.1

## 2014-01-15 MED ORDER — HYDROCHLOROTHIAZIDE 12.5 MG PO CAPS
12.5000 mg | ORAL_CAPSULE | Freq: Every day | ORAL | Status: DC
  Administered 2014-01-15 – 2014-01-16 (×2): 12.5 mg via ORAL
  Filled 2014-01-15 (×2): qty 1

## 2014-01-15 NOTE — Progress Notes (Signed)
Inpatient Diabetes Program Recommendations  AACE/ADA: New Consensus Statement on Inpatient Glycemic Control (2013)  Target Ranges:  Prepandial:   less than 140 mg/dL      Peak postprandial:   less than 180 mg/dL (1-2 hours)      Critically ill patients:  140 - 180 mg/dL   Results for Rebekah Ray, Rebekah Ray (MRN [id number]) as of 01/15/2014 09:56  Ref. Range 01/14/2014 14:56 01/15/2014 01:51  Glucose Latest Range: 70-99 mg/dL 116 (H) 371 (H)   Results for Rebekah Ray, Rebekah Ray (MRN [id number]) as of 01/15/2014 09:56  Ref. Range 01/15/2014 08:23  Glucose-Capillary Latest Range: 70-99 mg/dL 331 (H)   Diabetes history: DM2 Outpatient Diabetes medications: None Current orders for Inpatient glycemic control: Levemir 10 units QHS, Novolog 0-20 units AC, Novolog 0-5 units HS  Inpatient Diabetes Program Recommendations Insulin - Basal: Noted Levemir 10 units QHS was ordered this morning. Correction (SSI): Noted Novolog resistant correction ACHS was ordered this morning.  Note: Patient has a history of diabetes but is not currently on any DM medications.  Called patient and spoke with her over the phone.  Patient states that she use to be on diabetes medications in the past and has not taken any DM medications in over 3 years. Inquired about what medications she was on in the past and she can not remember what she was taking. Inquired about knowledge of and A1C and what last A1C was.  Patient does not remember what her last A1C was "but it was good" and she thinks her last A1C was done over 3 years ago.  Patient states that she took herself off of all DM meds about 3 years ago.  Noted on H&P that PCP was listed as Dr. Eligah East.  Called number on Internet for Dr. Seth Bake and was informed by the nursing staff that Dr. Seth Bake retired last year.  Inquired about patient and what DM meds she was prescribed in the past.  Nursing staff reports that patient was last seen in August 2011 and she was prescribed Metformin XR 500 mg  BID.  Noted A1C is ordered and will follow up with patient.  Will continue to follow.  Thanks, Barnie Alderman, RN, MSN, CCRN Diabetes Coordinator Inpatient Diabetes Program [phone] (Team Pager) [phone] (AP office) [phone] [facility] office)

## 2014-01-15 NOTE — Progress Notes (Signed)
Utilization Review Complete  

## 2014-01-15 NOTE — Progress Notes (Signed)
[facility] PROGRESS NOTE  Rebekah Ray [id number] DOB: 29-Nov-1955 DOA: 01/14/2014 PCP: Althia Forts, patient needs a new primary care physician.    Code Status: Full code Family Communication: Discussed with the patient's son Disposition Plan: anticipate discharge to home when clinically appropriate   Consultants:  None  Procedures:  None  Antibiotics:  Levaquin, start 01/15/14   HPI/Subjective: The patient still has chest congestion, but less shortness of breath. She denies chest pain.  Objective: Filed Vitals:   01/15/14 0748  BP: 166/80  Pulse:   Temp:   Resp:    pulse 84 respiratory rate 18 blood pressure 166/80 oxygen saturation 98% on supplemental oxygen.   Intake/Output Summary (Last 24 hours) at 01/15/14 1402 Last data filed at 01/15/14 0913  Gross per 24 hour  Intake    240 ml  Output   1450 ml  Net  -1210 ml   Filed Weights   01/14/14 1354 01/14/14 1855  Weight: 83.915 kg (185 lb) 79.379 kg (175 lb)    Exam:   General:  alert 58 year old Caucasian woman laying in bed, in no acute distress.  Cardiovascular: S1, S2, with a soft systolic murmur.  Respiratory: faint bilateral mid lobe crackles and occasional wheezes.  Abdomen: mildly obese, positive bowel sounds, soft, nontender, nondistended.  Musculoskeletal: no acute hot red joints.  Neurologic/psychiatric: She has a flat affect. Otherwise she is alert and oriented x3. Cranial nerves II through XII are grossly intact.   Data Reviewed: Basic Metabolic Panel:  Recent Labs Lab 01/14/14 1456 01/14/14 2107 01/15/14 0151  NA 144  --  137  K 3.3*  --  3.6*  CL 105  --  95*  CO2 24  --  16*  GLUCOSE 116*  --  371*  BUN 11  --  15  CREATININE 1.10  --  1.27*  CALCIUM 8.9  --  8.7  MG  --  1.9  --    Liver Function Tests:  Recent Labs Lab 01/15/14 0151  AST 18  ALT 15  ALKPHOS 110  BILITOT 0.5  PROT 8.3  ALBUMIN 3.8   No results found for this basename: LIPASE,  AMYLASE,  in the last 168 hours No results found for this basename: AMMONIA,  in the last 168 hours CBC:  Recent Labs Lab 01/14/14 1456 01/15/14 0151  WBC 5.5 4.7  NEUTROABS 3.0  --   HGB 14.6 14.0  HCT 42.8 41.9  MCV 84.8 84.6  PLT 237 234   Cardiac Enzymes:  Recent Labs Lab 01/14/14 1456 01/14/14 2040 01/15/14 0151 01/15/14 0857  TROPONINI <0.30 <0.30 <0.30 <0.30   BNP (last 3 results)  Recent Labs  01/14/14 1456  PROBNP 1147.0*   CBG:  Recent Labs Lab 01/15/14 0823 01/15/14 1157  GLUCAP 331* 347*    No results found for this or any previous visit (from the past 240 hour(s)).   Studies: Dg Chest 2 View  01/14/2014   CLINICAL DATA:  Cough and shortness of breath.  EXAM: CHEST  2 VIEW  COMPARISON:  No priors.  FINDINGS: Lung volumes are normal. No consolidative airspace disease. No pleural effusions. No evidence of pulmonary edema. Mild cephalization of the pulmonary vasculature. Mild cardiomegaly. Upper mediastinal contours are within normal limits. Atherosclerosis in the thoracic aorta.  IMPRESSION: 1. Mild cardiomegaly with pulmonary venous congestion, but no frank pulmonary edema. 2. Atherosclerosis.   Electronically Signed   By: Vinnie Langton M.D.   On: 01/14/2014 15:06    Scheduled Meds: .  amLODipine  5 mg Oral Daily  . antiseptic oral rinse  15 mL Mouth Rinse BID  . dextromethorphan-guaiFENesin  1 tablet Oral BID  . enoxaparin (LOVENOX) injection  40 mg Subcutaneous Q24H  . hydrALAZINE  25 mg Oral 4 times per day  . insulin aspart  0-20 Units Subcutaneous TID WC  . insulin aspart  0-5 Units Subcutaneous QHS  . insulin detemir  10 Units Subcutaneous QHS  . ipratropium-albuterol  3 mL Nebulization Q6H  . methylPREDNISolone (SOLU-MEDROL) injection  60 mg Intravenous Q6H  . sodium chloride  3 mL Intravenous Q12H   Continuous Infusions:   Assessment: Principal Problem:   Acute bronchitis Active Problems:   DM (diabetes mellitus)   Hypertensive  urgency   HTN (hypertension)   Noncompliance    1. COPD with COPD exacerbation/acute bronchitis. We'll continue Solu-Medrol and Atrovent/albuterol nebulizers. We'll add Levaquin empirically as patients tend to improve quicker on antibiotic therapy. Titrate oxygen to comfort and to keep her oxygen saturations greater than 92%.  Pulmonary vascular congestion on chest x-ray. There was no evidence of frank pulmonary edema however, the ED physician did give her 40 mg of IV Lasix. Her pro BNP is elevated, but not suggestive of acute heart failure. The patient has no history of heart failure. 2-D echocardiogram has been ordered and the results are pending.  Malignant hypertension. The patient has a history of hypertension, but has been noncompliant with medical followup and medical therapy. She was started on amlodipine and hydralazine. Both of these medications can cause peripheral edema, and therefore, we'll discontinue hydralazine, increase dose of amlodipine, and add HCTZ.  Type 2 diabetes mellitus. She is not taking any medications, secondary to noncompliance with medical followup. She had taken metformin and Actos in the past. Her blood glucoses elevated due to IV steroids. Sliding scale NovoLog and Lantus dosing was increased this morning. Will restart metformin at 5 mg twice a day.  Tobacco abuse. The patient was advised to stop smoking. She refuses a nicotine patch for now.  Noncompliance.     Plan: 1. We'll start Levaquin IV. 2. We'll start metformin twice a day. 3. We'll order tobacco cessation counseling. 4. Check hemoglobin A1c results pending. Check 2-D echocardiogram results pending. We'll order a TSH as well. 5. We'll ask the case management team to assist patient with finding a new primary care physician in [place].    Time spent: 35 minutes    [facility] Pager [phone] . If 7PM-7AM, please contact night-coverage at [email or web address], password  [facility] 01/15/2014, 2:02 PM  LOS: 1 day

## 2014-01-16 DIAGNOSIS — I509 Heart failure, unspecified: Secondary | ICD-10-CM

## 2014-01-16 DIAGNOSIS — J449 Chronic obstructive pulmonary disease, unspecified: Secondary | ICD-10-CM

## 2014-01-16 DIAGNOSIS — N179 Acute kidney failure, unspecified: Secondary | ICD-10-CM | POA: Diagnosis present

## 2014-01-16 LAB — URINALYSIS, ROUTINE W REFLEX MICROSCOPIC
Bilirubin Urine: NEGATIVE
GLUCOSE, UA: NEGATIVE mg/dL
Hgb urine dipstick: NEGATIVE
Ketones, ur: NEGATIVE mg/dL
Leukocytes, UA: NEGATIVE
NITRITE: NEGATIVE
PROTEIN: NEGATIVE mg/dL
Specific Gravity, Urine: 1.025 (ref 1.005–1.030)
Urobilinogen, UA: 0.2 mg/dL (ref 0.0–1.0)
pH: 5.5 (ref 5.0–8.0)

## 2014-01-16 LAB — BASIC METABOLIC PANEL
BUN: 43 mg/dL — AB (ref 6–23)
CO2: 21 mEq/L (ref 19–32)
CREATININE: 1.77 mg/dL — AB (ref 0.50–1.10)
Calcium: 9 mg/dL (ref 8.4–10.5)
Chloride: 98 mEq/L (ref 96–112)
GFR, EST AFRICAN AMERICAN: 35 mL/min — AB (ref >90)
GFR, EST NON AFRICAN AMERICAN: 31 mL/min — AB (ref >90)
Glucose, Bld: 303 mg/dL — ABNORMAL HIGH (ref 70–99)
Potassium: 4.1 mEq/L (ref 3.7–5.3)
Sodium: 136 mEq/L — ABNORMAL LOW (ref 137–147)

## 2014-01-16 LAB — CBC
HEMATOCRIT: 37 % (ref 36.0–46.0)
Hemoglobin: 12.5 g/dL (ref 12.0–15.0)
MCH: 28.5 pg (ref 26.0–34.0)
MCHC: 33.8 g/dL (ref 30.0–36.0)
MCV: 84.5 fL (ref 78.0–100.0)
Platelets: 254 10*3/uL (ref 150–400)
RBC: 4.38 MIL/uL (ref 3.87–5.11)
RDW: 13.7 % (ref 11.5–15.5)
WBC: 16.1 10*3/uL — ABNORMAL HIGH (ref 4.0–10.5)

## 2014-01-16 LAB — GLUCOSE, CAPILLARY
GLUCOSE-CAPILLARY: 218 mg/dL — AB (ref 70–99)
Glucose-Capillary: 305 mg/dL — ABNORMAL HIGH (ref 70–99)
Glucose-Capillary: 310 mg/dL — ABNORMAL HIGH (ref 70–99)
Glucose-Capillary: 273 mg/dL — ABNORMAL HIGH (ref 70–99)

## 2014-01-16 LAB — TSH: TSH: 3.974 u[IU]/mL (ref 0.350–4.500)

## 2014-01-16 MED ORDER — FUROSEMIDE 40 MG PO TABS
40.0000 mg | ORAL_TABLET | Freq: Once | ORAL | Status: AC
  Administered 2014-01-16: 40 mg via ORAL
  Filled 2014-01-16: qty 1

## 2014-01-16 MED ORDER — LEVOFLOXACIN IN D5W 500 MG/100ML IV SOLN
500.0000 mg | INTRAVENOUS | Status: DC
  Administered 2014-01-17: 500 mg via INTRAVENOUS
  Filled 2014-01-16: qty 100

## 2014-01-16 MED ORDER — NICOTINE 21 MG/24HR TD PT24
21.0000 mg | MEDICATED_PATCH | Freq: Every day | TRANSDERMAL | Status: DC
Start: 2014-01-16 — End: 2014-01-18
  Administered 2014-01-16 – 2014-01-18 (×3): 21 mg via TRANSDERMAL
  Filled 2014-01-16 (×3): qty 1

## 2014-01-16 MED ORDER — HEPARIN SODIUM (PORCINE) 5000 UNIT/ML IJ SOLN
5000.0000 [IU] | Freq: Three times a day (TID) | INTRAMUSCULAR | Status: DC
  Administered 2014-01-16 – 2014-01-18 (×6): 5000 [IU] via SUBCUTANEOUS
  Filled 2014-01-16 (×6): qty 1

## 2014-01-16 MED ORDER — HYDRALAZINE HCL 25 MG PO TABS
25.0000 mg | ORAL_TABLET | Freq: Three times a day (TID) | ORAL | Status: DC
  Administered 2014-01-16 – 2014-01-17 (×3): 25 mg via ORAL
  Filled 2014-01-16 (×3): qty 1

## 2014-01-16 MED ORDER — METHYLPREDNISOLONE SODIUM SUCC 125 MG IJ SOLR: 60.0000 mg | Freq: Two times a day (BID) | INTRAMUSCULAR | Status: DC

## 2014-01-16 NOTE — Progress Notes (Signed)
Inpatient Diabetes Program Recommendations  AACE/ADA: New Consensus Statement on Inpatient Glycemic Control (2013)  Target Ranges:  Prepandial:   less than 140 mg/dL      Peak postprandial:   less than 180 mg/dL (1-2 hours)      Critically ill patients:  140 - 180 mg/dL   Results for Rebekah Ray, Rebekah Ray (MRN [id number]) as of 01/16/2014 09:02  Ref. Range 01/15/2014 08:23 01/15/2014 11:57 01/15/2014 16:34 01/15/2014 20:50 01/16/2014 07:36  Glucose-Capillary Latest Range: 70-99 mg/dL 331 (H) 347 (H) 222 (H) 250 (H) 305 (H)   Diabetes history: DM2  Outpatient Diabetes medications: None  Current orders for Inpatient glycemic control: Levemir 10 units QHS, Novolog 0-20 units AC, Novolog 0-5 units HS, and Metformin 500 mg BID  Inpatient Diabetes Program Recommendations Insulin - Basal: Please consider increasing Lantus to 15 units QHS. Insulin - Meal Coverage: Please consider ordering Novolog 5 units TID with meals while on steroids.  Thanks, Barnie Alderman, RN, MSN, CCRN Diabetes Coordinator Inpatient Diabetes Program [phone] (Team Pager) [phone] (AP office) [phone] [facility] office)

## 2014-01-16 NOTE — Progress Notes (Signed)
[facility] PROGRESS NOTE  Rebekah Ray [id number] DOB: [birth year] DOA: 01/14/2014 PCP: No primary provider on file.   Brief narrative 58 year old female with history of COPD, hypertension, diabetes mellitus with noncompliance presented with acute COPD exacerbation.    Assessment/Plan: Acute hypoxic respiratory failure Secondary to acute COPD exacerbation. Continue IV Solu Medrol along with albuterol and Atrovent nebs. Will reduce frequency of solumedrol.  Continue O2 via nasal cannula. Continue empiric Levaquin. Continue antitussives. Symptoms improving. Counseled strongly on smoking cessation.  Diastolic CHF Patient presented with pulmonary vascular congestion on chest x-ray with elevated proBNP. She also had mild leg edema. 2-D echo showing normal EF but diastolic dysfunction. Patient still has some bibasilar crackles we'll add another dose of Lasix today and monitor for improvement.  Uncontrolled hypertension Possibly in the setting of medication noncompliance. Continue amlodipine and clonidine. I will resume hydralazine for better blood pressure control.  Diabetes mellitus Patient on metformin as outpatient which I will hold. A1C of  6.5. Continue on SSI   ? Medication noncompliance Recent reports losing her insurance and not following with her PCP for a while. She now has insurance and would like to establish care with a PCP [place] as [place] here. Will consult CM to provide her with list of PCP in community.  AKI Possibly hypertensive renal disease. She was also placed on HCTZ and metformin on admission. she also eceived a dose of IV Lasix on presentation.. A1C fairly stable.  will check UA. D/c HCTZ and metformin. Monitor in  A.m.  Tobacco abuse Counseled on cessation. Will order nicotine patch  Code Status: full code Family communication: friends at bedside  Disposition: Home likely in 1-2  days  Consultants:  None  Procedures:  None  Antibiotics:  Levaquin  HPI/Subjective: Patient seen and examined. Reports her breathing to be better but feels tired  Objective: Filed Vitals:   01/16/14 1448  BP: 160/81  Pulse: 99  Temp: 97.5 F (36.4 C)  Resp: 18    Intake/Output Summary (Last 24 hours) at 01/16/14 1529 Last data filed at 01/16/14 1300  Gross per 24 hour  Intake    582 ml  Output    150 ml  Net    432 ml   Filed Weights   01/14/14 1354 01/14/14 1855 01/16/14 0515  Weight: 83.915 kg (185 lb) 79.379 kg (175 lb) 80.196 kg (176 lb 12.8 oz)    Exam:   General:  Middle aged female in no acute distress  HEENT: No pallor, moist oral mucosa, no JVD  Chest: Fine bibasilar crackles and scattered rhonchi  Cardiovascular: Normal S1-S2, no murmurs rub or gallop  Abdomen: Soft, nontender, nondistended, bowel sounds present  Extremities: Warm, no edema  CNS: AAO x3    Data Reviewed: Basic Metabolic Panel:  Recent Labs Lab 01/14/14 1456 01/14/14 2107 01/15/14 0151 01/16/14 0617  NA 144  --  137 136*  K 3.3*  --  3.6* 4.1  CL 105  --  95* 98  CO2 24  --  16* 21  GLUCOSE 116*  --  371* 303*  BUN 11  --  15 43*  CREATININE 1.10  --  1.27* 1.77*  CALCIUM 8.9  --  8.7 9.0  MG  --  1.9  --   --    Liver Function Tests:  Recent Labs Lab 01/15/14 0151  AST 18  ALT 15  ALKPHOS 110  BILITOT 0.5  PROT 8.3  ALBUMIN 3.8   No  results found for this basename: LIPASE, AMYLASE,  in the last 168 hours No results found for this basename: AMMONIA,  in the last 168 hours CBC:  Recent Labs Lab 01/14/14 1456 01/15/14 0151 01/16/14 0617  WBC 5.5 4.7 16.1*  NEUTROABS 3.0  --   --   HGB 14.6 14.0 12.5  HCT 42.8 41.9 37.0  MCV 84.8 84.6 84.5  PLT 237 234 254   Cardiac Enzymes:  Recent Labs Lab 01/14/14 1456 01/14/14 2040 01/15/14 0151 01/15/14 0857  TROPONINI <0.30 <0.30 <0.30 <0.30   BNP (last 3 results)  Recent Labs   01/14/14 1456  PROBNP 1147.0*   CBG:  Recent Labs Lab 01/15/14 1157 01/15/14 1634 01/15/14 2050 01/16/14 0736 01/16/14 1117  GLUCAP 347* 222* 250* 305* 310*    No results found for this or any previous visit (from the past 240 hour(s)).   Studies: No results found.  Scheduled Meds: . amLODipine  10 mg Oral Daily  . dextromethorphan-guaiFENesin  1 tablet Oral BID  . famotidine  20 mg Oral Daily  . heparin subcutaneous  5,000 Units Subcutaneous 3 times per day  . hydrALAZINE  25 mg Oral 3 times per day  . insulin aspart  0-20 Units Subcutaneous TID WC  . insulin aspart  0-5 Units Subcutaneous QHS  . insulin detemir  10 Units Subcutaneous QHS  . ipratropium-albuterol  3 mL Nebulization Q6H  . [START ON 01/17/2014] levofloxacin (LEVAQUIN) IV  500 mg Intravenous Q48H  . methylPREDNISolone (SOLU-MEDROL) injection  60 mg Intravenous Q6H  . sodium chloride  3 mL Intravenous Q12H   Continuous Infusions:     Time spent: 25 minutes    Zalmen Wrightsman  [facility] Pager [phone] If 7PM-7AM, please contact night-coverage at [email or web address], password [facility] 01/16/2014, 3:29 PM  LOS: 2 days

## 2014-01-16 NOTE — Progress Notes (Signed)
Pt educated verbally and via handout concerning CHF.  Patient verbalizes understanding and sharing increasing concern for medical cost and losing medical insurance.  Educated pt on community resources as well.

## 2014-01-16 NOTE — Progress Notes (Signed)
Pt up to chair no c/o pain or shortness of breath.  Pt concerned about not having sick time at work.  Pt edema of lower extremity reduced according to last shift assessment.

## 2014-01-17 ENCOUNTER — Inpatient Hospital Stay (HOSPITAL_COMMUNITY): Payer: BC Managed Care – PPO

## 2014-01-17 DIAGNOSIS — N179 Acute kidney failure, unspecified: Secondary | ICD-10-CM

## 2014-01-17 LAB — BASIC METABOLIC PANEL
BUN: 58 mg/dL — ABNORMAL HIGH (ref 6–23)
CALCIUM: 9 mg/dL (ref 8.4–10.5)
CHLORIDE: 97 meq/L (ref 96–112)
CO2: 24 mEq/L (ref 19–32)
CREATININE: 1.97 mg/dL — AB (ref 0.50–1.10)
GFR, EST AFRICAN AMERICAN: 31 mL/min — AB (ref >90)
GFR, EST NON AFRICAN AMERICAN: 27 mL/min — AB (ref >90)
Glucose, Bld: 283 mg/dL — ABNORMAL HIGH (ref 70–99)
Potassium: 4.2 mEq/L (ref 3.7–5.3)
Sodium: 137 mEq/L (ref 137–147)

## 2014-01-17 LAB — GLUCOSE, CAPILLARY
GLUCOSE-CAPILLARY: 281 mg/dL — AB (ref 70–99)
GLUCOSE-CAPILLARY: 183 mg/dL — AB (ref 70–99)
GLUCOSE-CAPILLARY: 275 mg/dL — AB (ref 70–99)
Glucose-Capillary: 344 mg/dL — ABNORMAL HIGH (ref 70–99)

## 2014-01-17 LAB — URIC ACID: Uric Acid, Serum: 12.2 mg/dL — ABNORMAL HIGH (ref 2.4–7.0)

## 2014-01-17 LAB — SODIUM, URINE, RANDOM: Sodium, Ur: <20 mEq/L

## 2014-01-17 LAB — CREATININE, URINE, RANDOM: CREATININE, URINE: 115.87 mg/dL

## 2014-01-17 MED ORDER — PREDNISONE 20 MG PO TABS
50.0000 mg | ORAL_TABLET | Freq: Every day | ORAL | Status: DC
  Administered 2014-01-18: 50 mg via ORAL
  Filled 2014-01-17: qty 2
  Filled 2014-01-17: qty 1

## 2014-01-17 MED ORDER — INSULIN ASPART 100 UNIT/ML ~~LOC~~ SOLN
3.0000 [IU] | Freq: Three times a day (TID) | SUBCUTANEOUS | Status: DC
  Administered 2014-01-17 – 2014-01-18 (×3): 3 [IU] via SUBCUTANEOUS

## 2014-01-17 MED ORDER — INSULIN DETEMIR 100 UNIT/ML ~~LOC~~ SOLN
16.0000 [IU] | Freq: Every day | SUBCUTANEOUS | Status: DC
  Administered 2014-01-17: 16 [IU] via SUBCUTANEOUS
  Filled 2014-01-17 (×4): qty 0.16

## 2014-01-17 MED ORDER — LEVOFLOXACIN 500 MG PO TABS: 500.0000 mg | ORAL_TABLET | ORAL | Status: DC

## 2014-01-17 MED ORDER — HYDRALAZINE HCL 25 MG PO TABS
50.0000 mg | ORAL_TABLET | Freq: Three times a day (TID) | ORAL | Status: DC
  Administered 2014-01-17 – 2014-01-18 (×3): 50 mg via ORAL
  Filled 2014-01-17 (×3): qty 2

## 2014-01-17 NOTE — Progress Notes (Signed)
PHARMACIST - PHYSICIAN COMMUNICATION DR:   Dhungel CONCERNING: Antibiotic IV to Oral Route Change Policy  RECOMMENDATION: This patient is receiving Levaquin by the intravenous route.  Based on criteria approved by the Pharmacy and Therapeutics Committee, the antibiotic(s) is/are being converted to the equivalent oral dose form(s).  DESCRIPTION: These criteria include:  Patient being treated for a respiratory tract infection, urinary tract infection, cellulitis or clostridium difficile associated diarrhea if on metronidazole  The patient is not neutropenic and does not exhibit a GI malabsorption state  The patient is eating (either orally or via tube) and/or has been taking other orally administered medications for a least 24 hours  The patient is improving clinically and has a Tmax < 100.5  If you have questions about this conversion, please contact the Pharmacy Department  [x]   [phone] )  [facility] []   [phone] )  [facility]  []   [phone] )  [facility] []   [phone] )  [facility]   S. [place] Crane, PharmD

## 2014-01-17 NOTE — Progress Notes (Signed)
[facility] PROGRESS NOTE  Rebekah Ray [id number] DOB: Mar 27, 1956 DOA: 01/14/2014 PCP: No primary provider on file.   Brief narrative 58 year old female with history of COPD, hypertension, diabetes mellitus with noncompliance presented with acute COPD exacerbation.    Assessment/Plan: Acute hypoxic respiratory failure Secondary to acute COPD exacerbation. Continue albuterol and Atrovent nebs. Switch IV solumedrol  to oral prednisone. Continue O2 via nasal cannula. Continue empiric Levaquin. Continue antitussives. Symptoms improving. Counseled strongly on smoking cessation.  Diastolic CHF Patient presented with pulmonary vascular congestion on chest x-ray with elevated proBNP. She also had mild leg edema. 2-D echo showing normal EF but diastolic dysfunction. Symptoms now improving after few doses of IV lasix.   Uncontrolled hypertension Possibly in the setting of medication noncompliance. Continue amlodipine . resumed and increased dose of  hydralazine for better blood pressure control.  Diabetes mellitus Patient on metformin as outpatient which is on hold . A1C of  6.5. Continue on SSI   ? Medication noncompliance  reports losing her insurance and not following with her PCP for a while. She now has insurance and would like to establish care with a PCP in [place] as she has moved here. Will consult CM to provide her with list of PCP in community.  AKI Possibly hypertensive renal disease. She was also placed on HCTZ and metformin on admission. she also received a dose of IV Lasix on presentation.. A1C fairly stable.  will check UA unremarkable.. D/c HCTZ and metformin.  worsened renal Fn this am . Check urine lytes, uric acid, TSH and renal US  Tobacco abuse Counseled on cessation. Will order nicotine patch  Code Status: full code Family communication: None at bedside  Disposition: Home likely in 1-2  days  Consultants:  None  Procedures:  None  Antibiotics:  Levaquin  HPI/Subjective: Patient seen and examined. Reports her breathing to be better  Objective: Filed Vitals:   01/17/14 0650  BP: 166/83  Pulse:   Temp:   Resp:     Intake/Output Summary (Last 24 hours) at 01/17/14 1438 Last data filed at 01/17/14 0900  Gross per 24 hour  Intake    483 ml  Output   3300 ml  Net  -2817 ml   Filed Weights   01/14/14 1855 01/16/14 0515 01/17/14 0601  Weight: 79.379 kg (175 lb) 80.196 kg (176 lb 12.8 oz) 79.425 kg (175 lb 1.6 oz)    Exam:   General:  Middle aged female in no acute distress  HEENT: No pallor, moist oral mucosa, no JVD  Chest: clear b/l  Cardiovascular: Normal S1-S2, no murmurs rub or gallop  Abdomen: Soft, nontender, nondistended, bowel sounds present  Extremities: Warm, no edema  CNS: AAO x3    Data Reviewed: Basic Metabolic Panel:  Recent Labs Lab 01/14/14 1456 01/14/14 2107 01/15/14 0151 01/16/14 0617 01/17/14 0542  NA 144  --  137 136* 137  K 3.3*  --  3.6* 4.1 4.2  CL 105  --  95* 98 97  CO2 24  --  16* 21 24  GLUCOSE 116*  --  371* 303* 283*  BUN 11  --  15 43* 58*  CREATININE 1.10  --  1.27* 1.77* 1.97*  CALCIUM 8.9  --  8.7 9.0 9.0  MG  --  1.9  --   --   --    Liver Function Tests:  Recent Labs Lab 01/15/14 0151  AST 18  ALT 15  ALKPHOS 110  BILITOT 0.5  PROT  8.3  ALBUMIN 3.8   No results found for this basename: LIPASE, AMYLASE,  in the last 168 hours No results found for this basename: AMMONIA,  in the last 168 hours CBC:  Recent Labs Lab 01/14/14 1456 01/15/14 0151 01/16/14 0617  WBC 5.5 4.7 16.1*  NEUTROABS 3.0  --   --   HGB 14.6 14.0 12.5  HCT 42.8 41.9 37.0  MCV 84.8 84.6 84.5  PLT 237 234 254   Cardiac Enzymes:  Recent Labs Lab 01/14/14 1456 01/14/14 2040 01/15/14 0151 01/15/14 0857  TROPONINI <0.30 <0.30 <0.30 <0.30   BNP (last 3 results)  Recent Labs  01/14/14 1456   PROBNP 1147.0*   CBG:  Recent Labs Lab 01/16/14 1117 01/16/14 1646 01/16/14 2024 01/17/14 0756 01/17/14 1139  GLUCAP 310* 218* 273* 281* 344*    No results found for this or any previous visit (from the past 240 hour(s)).   Studies: US Renal  01/17/2014   CLINICAL DATA:  Acute kidney injury  EXAM: RENAL/URINARY TRACT ULTRASOUND COMPLETE  COMPARISON:  None.  FINDINGS: Right Kidney:  Length: 9.1 cm. 10 mm complex cyst midpole. Echogenicity within normal limits. No mass or hydronephrosis visualized.  Left Kidney:  Length: 9.2 cm. Echogenicity within normal limits. No mass or hydronephrosis visualized.  Bladder:  Appears normal for degree of bladder distention.  IMPRESSION: Negative   Electronically Signed   By: Franchot Gallo M.D.   On: 01/17/2014 11:12    Scheduled Meds: . amLODipine  10 mg Oral Daily  . dextromethorphan-guaiFENesin  1 tablet Oral BID  . famotidine  20 mg Oral Daily  . heparin subcutaneous  5,000 Units Subcutaneous 3 times per day  . hydrALAZINE  50 mg Oral 3 times per day  . insulin aspart  0-20 Units Subcutaneous TID WC  . insulin aspart  0-5 Units Subcutaneous QHS  . insulin aspart  3 Units Subcutaneous TID WC  . insulin detemir  16 Units Subcutaneous QHS  . ipratropium-albuterol  3 mL Nebulization Q6H  . [START ON 01/19/2014] levofloxacin  500 mg Oral Q48H  . methylPREDNISolone (SOLU-MEDROL) injection  60 mg Intravenous Q12H  . nicotine  21 mg Transdermal Daily  . sodium chloride  3 mL Intravenous Q12H   Continuous Infusions:     Time spent: 25 minutes    Eleah Lahaie, [place]  [facility] Pager [phone] If 7PM-7AM, please contact night-coverage at [email or web address], password [facility] 01/17/2014, 2:38 PM  LOS: 3 days

## 2014-01-17 NOTE — Progress Notes (Signed)
Educated patient about the importance of smoking cessation. Patient received educational papers about quitting smoking. Patient stated that at this time she did not have any questions and that she did want to stop smoking so she will read the educational papers. Will continue to encourage smoking cessation.

## 2014-01-18 DIAGNOSIS — J449 Chronic obstructive pulmonary disease, unspecified: Secondary | ICD-10-CM

## 2014-01-18 LAB — GLUCOSE, CAPILLARY: Glucose-Capillary: 223 mg/dL — ABNORMAL HIGH (ref 70–99)

## 2014-01-18 LAB — BASIC METABOLIC PANEL
BUN: 53 mg/dL — AB (ref 6–23)
CO2: 24 mEq/L (ref 19–32)
Calcium: 9.1 mg/dL (ref 8.4–10.5)
Chloride: 97 mEq/L (ref 96–112)
Creatinine, Ser: 1.73 mg/dL — ABNORMAL HIGH (ref 0.50–1.10)
GFR calc non Af Amer: 31 mL/min — ABNORMAL LOW (ref >90)
GFR, EST AFRICAN AMERICAN: 36 mL/min — AB (ref >90)
GLUCOSE: 259 mg/dL — AB (ref 70–99)
POTASSIUM: 4.2 meq/L (ref 3.7–5.3)
Sodium: 135 mEq/L — ABNORMAL LOW (ref 137–147)

## 2014-01-18 LAB — TSH: TSH: 2.124 u[IU]/mL (ref 0.350–4.500)

## 2014-01-18 MED ORDER — GLIPIZIDE 5 MG PO TABS: 5.0000 mg | ORAL_TABLET | Freq: Every day | ORAL | Status: DC

## 2014-01-18 MED ORDER — AMLODIPINE BESYLATE 10 MG PO TABS: 10.0000 mg | ORAL_TABLET | Freq: Every day | ORAL | Status: DC

## 2014-01-18 MED ORDER — HYDRALAZINE HCL 50 MG PO TABS: 50.0000 mg | ORAL_TABLET | Freq: Three times a day (TID) | ORAL | Status: AC

## 2014-01-18 MED ORDER — ALBUTEROL SULFATE HFA 108 (90 BASE) MCG/ACT IN AERS: 2.0000 | INHALATION_SPRAY | Freq: Four times a day (QID) | RESPIRATORY_TRACT | Status: DC | PRN

## 2014-01-18 MED ORDER — NICOTINE 21 MG/24HR TD PT24: 21.0000 mg | MEDICATED_PATCH | Freq: Every day | TRANSDERMAL | Status: DC

## 2014-01-18 MED ORDER — PREDNISONE 20 MG PO TABS
40.0000 mg | ORAL_TABLET | Freq: Every day | ORAL | Status: DC
Start: 2014-01-18 — End: 2014-02-20

## 2014-01-18 MED ORDER — LEVOFLOXACIN 500 MG PO TABS: 500.0000 mg | ORAL_TABLET | ORAL | Status: AC

## 2014-01-18 NOTE — Progress Notes (Signed)
Patient discharged home.  IV removed - WNL.  Follow up in place with a PCP.  Instructed on new medications and how to take.  Advised to quit smoking, education given.  Also educated on HF management at home.  Patient verbalizes understanding of daily weights and low sodium diet with teach back.  Patient able to state what HF zone she is is and understands when to call MD.  No questions at this time.  Stable to DC home.  Left floor via [place] with RN

## 2014-01-18 NOTE — Care Management Note (Signed)
    Page 1 of 1   01/18/2014     11:13:50 AM   CARE MANAGEMENT NOTE 01/18/2014  Patient:  Rebekah Ray, Rebekah Ray   Account Number:  [id number]  Date Initiated:  01/18/2014  Documentation initiated by:  Claretha Cooper  Subjective/Objective Assessment:   Pt admitted from home. Does not have PCP. CM gave three choices of PCP's accepting new pts and pt chose Dr. Cindie Laroche. Appt made.     Action/Plan:   Anticipated DC Date:  01/18/2014   Anticipated DC Plan:  HOME/SELF CARE      DC Planning Services  CM consult      Choice offered to / List presented to:             Status of service:  Completed, signed off Medicare Important Message given?   (If response is "NO", the following Medicare IM given date fields will be blank) Date Medicare IM given:   Date Additional Medicare IM given:    Discharge Disposition:    Per UR Regulation:    If discussed at Long Length of Stay Meetings, dates discussed:    Comments:  01/18/14 Claretha Cooper RN BSN CM

## 2014-01-18 NOTE — Discharge Summary (Signed)
Physician Discharge Summary  Rebekah Ray [id number] DOB: [birth year] DOA: 01/14/2014  PCP: Maricela Curet, MD  Admit date: 01/14/2014 Discharge date: 01/18/2014  Time spent: 40 minutes  Recommendations for Outpatient Follow-up:  1. Home with outpt PCP follow up  Discharge Diagnoses:  Principal Problem:   Acute bronchitis with hypoxic respiratory failure.  Active Problems:   Hypertensive urgency   Acute kidney injury   CHF (congestive heart failure)   DM (diabetes mellitus)   Tobacco abuse   HTN (hypertension)   Noncompliance   COPD (chronic obstructive pulmonary disease)  Hyperuricemia  Discharge Condition: fair  Diet recommendation: daibetic  Filed Weights   01/16/14 0515 01/17/14 0601 01/18/14 0500  Weight: 80.196 kg (176 lb 12.8 oz) 79.425 kg (175 lb 1.6 oz) 78.926 kg (174 lb)    History of present illness:  Please refer to admission H&P for details, but in brief, 58 year old female with history oral hypertension, type 2 diabetes mellitus, COPD, active tobacco use who presented with through dose oral shortness of breath on exertion with cough. Patient felt to have hypertensive urgency on presentation. She has not seen a provider for almost 2 years and has not been on any medications for her blood pressure or diabetes.In the ED her blood pressure is elevated 202/100, chest x-ray shows pulmonary vascular congestion and cardiomegaly but no frank pulmonary edema. BNP is elevated to 1100. She did receive nebulizer treatments in the ED with not much relief. She also got one dose of Lasix 40 mg IV x1.  Patient denies any chest pain, no nausea vomiting or diarrhea. She also admits to having orthopnea. Patient admitted to medical floor on telemetry for findings of acute bronchitis, CHF and hypertensive urgency.Marland Kitchen   Hospital Course:  Acute hypoxic respiratory failure  Secondary to acute COPD exacerbation. Patient placed on 02 via [place], IV Solu-Medrol and  on  albuterol and  Atrovent nebs.  Switched IV solumedrol to oral prednisone once symptoms improved.  -Added empiric Levaquin. Symptoms now improved. Counseled strongly on smoking cessation.  -I will discharge her on oral prednisone for 5 more days. She will complete her five-day course of Levaquin tomorrow. I will discharge her on albuterol inhaler prn and should be prescribed with steroid inhaler and atrovent inhaler during outpatient visit once she is off oral prednisone.  Diastolic CHF  Patient presented with pulmonary vascular congestion on chest x-ray with elevated proBNP. She also had mild leg edema. 2-D echo showing normal EF but diastolic dysfunction.  Symptoms now resolved after few doses of IV lasix.   Uncontrolled hypertension  Possibly in the setting of medication noncompliance. Continue amlodipine . adjusted  dose of hydralazine for better blood pressure control. Blood pressure fairly stable today and will continue these meds upon discharge.  Diabetes mellitus  . A1C of 6.5. Blood glucose has been elevated as patient is on steroids. I will discharge her on glipizide 5 mg daily. counseled on diet restriction and exercise to lose weight.   ? Medication noncompliance  reports losing her insurance and not following with her PCP for a while. She now has insurance and would like to establish care with a PCP in region as she has moved here.  Patient has been assigned a new PCP in the community.  AKI  Possibly hypertensive renal disease with underlying chronic kidney disease. She was also placed on HCTZ and metformin on admission and received 2 doses of IV Lasix..  . A1C fairly stable.  - UA unremarkable.. -FeNa of 0.2,  renal ultrasound unremarkable. She had marked hyperuricemia with uric acid of 12 and reports occasional gouty arthritis or for great toe. She is asymptomatic at this time. -TSH is pending. Needs outpatient monitoring offloading of function.  Tobacco abuse  Counseled on cessation. Will  order nicotine patch .  Code Status: full code  Family communication: None at bedside  Disposition: Home outpatient PCP followup  Consultants:  None Procedures:  2D echo  Antibiotics:  Levaquin   Discharge Exam: Filed Vitals:   01/18/14 0654  BP: 152/80  Pulse: 71  Temp:   Resp:     General: Middle aged female in no acute distress  HEENT: No pallor, moist oral mucosa, no JVD  chest: clear b/l, no added sounds  Cardiovascular: Normal S1-S2, no murmurs rub or gallop  Abdomen: Soft, nontender, nondistended, bowel sounds present  Extremities: Warm, no edema  CNS: AAO x3   Discharge Instructions     Medication List         albuterol 108 (90 BASE) MCG/ACT inhaler  Commonly known as:  PROVENTIL HFA;VENTOLIN HFA  Inhale 2 puffs into the lungs every 6 (six) hours as needed for wheezing or shortness of breath.     amLODipine 10 MG tablet  Commonly known as:  NORVASC  Take 1 tablet (10 mg total) by mouth daily.     glipiZIDE 5 MG tablet  Commonly known as:  GLUCOTROL  Take 1 tablet (5 mg total) by mouth daily before breakfast.     guaifenesin 100 MG/5ML syrup  Commonly known as:  ROBITUSSIN  Take 200 mg by mouth 3 (three) times daily as needed for cough.     hydrALAZINE 50 MG tablet  Commonly known as:  APRESOLINE  Take 1 tablet (50 mg total) by mouth every 8 (eight) hours.     levofloxacin 500 MG tablet  Commonly known as:  LEVAQUIN  Take 1 tablet (500 mg total) by mouth every other day.  Start taking on:  01/19/2014     nicotine 21 mg/24hr patch  Commonly known as:  NICODERM CQ - dosed in mg/24 hours  Place 1 patch (21 mg total) onto the skin daily.     predniSONE 20 MG tablet  Commonly known as:  DELTASONE  Take 2 tablets (40 mg total) by mouth daily with breakfast.       No Known Allergies     Follow-up Information   Follow up with Maricela Curet, MD On 01/30/2014. (@ 9:00 am, Bring medication list and insurance card)    Specialty:  Internal  Medicine   Contact information:   [place] [address] [phone]        The results of significant diagnostics from this hospitalization (including imaging, microbiology, ancillary and laboratory) are listed below for reference.    Significant Diagnostic Studies: Dg Chest 2 View  01/14/2014   CLINICAL DATA:  Cough and shortness of breath.  EXAM: CHEST  2 VIEW  COMPARISON:  No priors.  FINDINGS: Lung volumes are normal. No consolidative airspace disease. No pleural effusions. No evidence of pulmonary edema. Mild cephalization of the pulmonary vasculature. Mild cardiomegaly. Upper mediastinal contours are within normal limits. Atherosclerosis in the thoracic aorta.  IMPRESSION: 1. Mild cardiomegaly with pulmonary venous congestion, but no frank pulmonary edema. 2. Atherosclerosis.   Electronically Signed   By: Vinnie Langton M.D.   On: 01/14/2014 15:06   US Renal  01/17/2014   CLINICAL DATA:  Acute kidney injury  EXAM: RENAL/URINARY  TRACT ULTRASOUND COMPLETE  COMPARISON:  None.  FINDINGS: Right Kidney:  Length: 9.1 cm. 10 mm complex cyst midpole. Echogenicity within normal limits. No mass or hydronephrosis visualized.  Left Kidney:  Length: 9.2 cm. Echogenicity within normal limits. No mass or hydronephrosis visualized.  Bladder:  Appears normal for degree of bladder distention.  IMPRESSION: Negative   Electronically Signed   By: Franchot Gallo M.D.   On: 01/17/2014 11:12    Microbiology: No results found for this or any previous visit (from the past 240 hour(s)).   Labs: Basic Metabolic Panel:  Recent Labs Lab 01/14/14 1456 01/14/14 2107 01/15/14 0151 01/16/14 0617 01/17/14 0542 01/18/14 0759  NA 144  --  137 136* 137 135*  K 3.3*  --  3.6* 4.1 4.2 4.2  CL 105  --  95* 98 97 97  CO2 24  --  16* 21 24 24   GLUCOSE 116*  --  371* 303* 283* 259*  BUN 11  --  15 43* 58* 53*  CREATININE 1.10  --  1.27* 1.77* 1.97* 1.73*  CALCIUM 8.9  --  8.7 9.0 9.0 9.1  MG   --  1.9  --   --   --   --    Liver Function Tests:  Recent Labs Lab 01/15/14 0151  AST 18  ALT 15  ALKPHOS 110  BILITOT 0.5  PROT 8.3  ALBUMIN 3.8   No results found for this basename: LIPASE, AMYLASE,  in the last 168 hours No results found for this basename: AMMONIA,  in the last 168 hours CBC:  Recent Labs Lab 01/14/14 1456 01/15/14 0151 01/16/14 0617  WBC 5.5 4.7 16.1*  NEUTROABS 3.0  --   --   HGB 14.6 14.0 12.5  HCT 42.8 41.9 37.0  MCV 84.8 84.6 84.5  PLT 237 234 254   Cardiac Enzymes:  Recent Labs Lab 01/14/14 1456 01/14/14 2040 01/15/14 0151 01/15/14 0857  TROPONINI <0.30 <0.30 <0.30 <0.30   BNP: BNP (last 3 results)  Recent Labs  01/14/14 1456  PROBNP 1147.0*   CBG:  Recent Labs Lab 01/17/14 0756 01/17/14 1139 01/17/14 1651 01/17/14 2130 01/18/14 0814  GLUCAP 281* 344* 183* 275* 223*       Signed:  Sandro Burgo  [facility] 01/18/2014, 10:37 AM

## 2014-01-22 ENCOUNTER — Encounter: Payer: Self-pay | Admitting: Internal Medicine

## 2014-02-20 ENCOUNTER — Emergency Department (HOSPITAL_COMMUNITY)
Admission: EM | Admit: 2014-02-20 | Discharge: 2014-02-20 | Disposition: A | Discharge status: Home / Self Care | Payer: BC Managed Care – PPO | Attending: Emergency Medicine | Admitting: Emergency Medicine

## 2014-02-20 ENCOUNTER — Emergency Department (HOSPITAL_COMMUNITY): Payer: BC Managed Care – PPO

## 2014-02-20 ENCOUNTER — Encounter (HOSPITAL_COMMUNITY): Payer: Self-pay | Admitting: Emergency Medicine

## 2014-02-20 DIAGNOSIS — R200 Anesthesia of skin: Secondary | ICD-10-CM

## 2014-02-20 DIAGNOSIS — I10 Essential (primary) hypertension: Secondary | ICD-10-CM | POA: Insufficient documentation

## 2014-02-20 DIAGNOSIS — J4489 Other specified chronic obstructive pulmonary disease: Secondary | ICD-10-CM | POA: Insufficient documentation

## 2014-02-20 DIAGNOSIS — IMO0002 Reserved for concepts with insufficient information to code with codable children: Secondary | ICD-10-CM | POA: Insufficient documentation

## 2014-02-20 DIAGNOSIS — F17200 Nicotine dependence, unspecified, uncomplicated: Secondary | ICD-10-CM | POA: Insufficient documentation

## 2014-02-20 DIAGNOSIS — R209 Unspecified disturbances of skin sensation: Secondary | ICD-10-CM | POA: Insufficient documentation

## 2014-02-20 DIAGNOSIS — J449 Chronic obstructive pulmonary disease, unspecified: Secondary | ICD-10-CM | POA: Insufficient documentation

## 2014-02-20 DIAGNOSIS — E039 Hypothyroidism, unspecified: Secondary | ICD-10-CM | POA: Insufficient documentation

## 2014-02-20 DIAGNOSIS — Z79899 Other long term (current) drug therapy: Secondary | ICD-10-CM | POA: Insufficient documentation

## 2014-02-20 DIAGNOSIS — E119 Type 2 diabetes mellitus without complications: Secondary | ICD-10-CM | POA: Insufficient documentation

## 2014-02-20 LAB — CBC WITH DIFFERENTIAL/PLATELET
Basophils Absolute: 0 10*3/uL (ref 0.0–0.1)
Basophils Relative: 1 % (ref 0–1)
EOS ABS: 0.1 10*3/uL (ref 0.0–0.7)
Eosinophils Relative: 2 % (ref 0–5)
HEMATOCRIT: 37.5 % (ref 36.0–46.0)
HEMOGLOBIN: 12.5 g/dL (ref 12.0–15.0)
LYMPHS ABS: 1.5 10*3/uL (ref 0.7–4.0)
Lymphocytes Relative: 24 % (ref 12–46)
MCH: 28.2 pg (ref 26.0–34.0)
MCHC: 33.3 g/dL (ref 30.0–36.0)
MCV: 84.7 fL (ref 78.0–100.0)
MONO ABS: 0.6 10*3/uL (ref 0.1–1.0)
MONOS PCT: 9 % (ref 3–12)
NEUTROS PCT: 65 % (ref 43–77)
Neutro Abs: 4.1 10*3/uL (ref 1.7–7.7)
Platelets: 317 10*3/uL (ref 150–400)
RBC: 4.43 MIL/uL (ref 3.87–5.11)
RDW: 13.6 % (ref 11.5–15.5)
WBC: 6.3 10*3/uL (ref 4.0–10.5)

## 2014-02-20 LAB — URINALYSIS, ROUTINE W REFLEX MICROSCOPIC
Bilirubin Urine: NEGATIVE
Glucose, UA: NEGATIVE mg/dL
Hgb urine dipstick: NEGATIVE
KETONES UR: NEGATIVE mg/dL
Nitrite: NEGATIVE
PROTEIN: NEGATIVE mg/dL
Specific Gravity, Urine: 1.01 (ref 1.005–1.030)
Urobilinogen, UA: 0.2 mg/dL (ref 0.0–1.0)
pH: 7 (ref 5.0–8.0)

## 2014-02-20 LAB — COMPREHENSIVE METABOLIC PANEL
ALK PHOS: 98 U/L (ref 39–117)
ALT: 19 U/L (ref 0–35)
AST: 20 U/L (ref 0–37)
Albumin: 4 g/dL (ref 3.5–5.2)
BUN: 24 mg/dL — ABNORMAL HIGH (ref 6–23)
CO2: 24 mEq/L (ref 19–32)
Calcium: 9.7 mg/dL (ref 8.4–10.5)
Chloride: 103 mEq/L (ref 96–112)
Creatinine, Ser: 1.34 mg/dL — ABNORMAL HIGH (ref 0.50–1.10)
GFR calc Af Amer: 50 mL/min — ABNORMAL LOW (ref >90)
GFR calc non Af Amer: 43 mL/min — ABNORMAL LOW (ref >90)
GLUCOSE: 204 mg/dL — AB (ref 70–99)
POTASSIUM: 4 meq/L (ref 3.7–5.3)
Sodium: 143 mEq/L (ref 137–147)
TOTAL PROTEIN: 8.3 g/dL (ref 6.0–8.3)
Total Bilirubin: 0.6 mg/dL (ref 0.3–1.2)

## 2014-02-20 LAB — CBG MONITORING, ED: Glucose-Capillary: 175 mg/dL — ABNORMAL HIGH (ref 70–99)

## 2014-02-20 LAB — URINE MICROSCOPIC-ADD ON

## 2014-02-20 LAB — TSH: TSH: 3.15 u[IU]/mL (ref 0.350–4.500)

## 2014-02-20 NOTE — ED Notes (Signed)
Pt reports has had problems with numbness, tingling, and legs swelling for the past 3 weeks.  Reports approx 1 hour ago was standing at work and legs became weak and both hands started feeling numb along with left side of face.  Pt denies any pain.

## 2014-02-20 NOTE — ED Notes (Signed)
Pt says face and hands no longer feel numb but still having some intermittent numbness in both legs.

## 2014-02-20 NOTE — ED Notes (Signed)
Pt states legs have been numb and tingling since march 29th, pt states that this morning lt arm and lt side of face numb and tingling. Pt states face numbness just started within the hour.

## 2014-02-20 NOTE — ED Provider Notes (Signed)
CSN: [id number]     Arrival date & time 02/20/14  0729 History  This chart was scribed for Nat Christen, MD by Ludger Nutting, ED Scribe. This patient was seen in room APA19/APA19 and the patient's care was started 7:54 AM.    Chief Complaint  Patient presents with  . Numbness      The history is provided by the patient. No language interpreter was used.    HPI Comments: Rebekah Ray is a 58 y.o. female with past medical history of HTN, DM, COPD who presents to the Emergency Department complaining of 3 weeks of intermittent numbness and paresthesias to her BLE. Patient states she began having BUE weakness and paresthesias about 1 hour ago while at work this morning which has since resolved. She also had associated left sided facial numbness. She reports being able to ambulate but states it feels as though her legs are going to buckle. She reports having a a lower extremity ultrasound scheduled because her PCP is concerned for "leaking veins." She was recently diagnosed with hypothyroidism and started on medication last week. Patient states she was hospitalized in March 2015. She denies new or worsened SOB, chest pain.   PCP DonDiego Past Medical History  Diagnosis Date  . Hypertension   . Diabetes mellitus without complication   . COPD (chronic obstructive pulmonary disease)    Past Surgical History  Procedure Laterality Date  . Cesarean section    . Tubal ligation    . Breast biopsy     History reviewed. No pertinent family history. History  Substance Use Topics  . Smoking status: Current Every Day Smoker    Types: Cigarettes  . Smokeless tobacco: Former Systems developer  . Alcohol Use: No   OB History   Grav Para Term Preterm Abortions TAB SAB Ect Mult Living                 Review of Systems  A complete 10 system review of systems was obtained and all systems are negative except as noted in the HPI and PMH.    Allergies  Review of patient's allergies indicates no known  allergies.  Home Medications   Prior to Admission medications   Medication Sig Start Date End Date Taking? Authorizing Provider  albuterol (PROVENTIL HFA;VENTOLIN HFA) 108 (90 BASE) MCG/ACT inhaler Inhale 2 puffs into the lungs every 6 (six) hours as needed for wheezing or shortness of breath. 01/18/14   Nishant Dhungel, MD  amLODipine (NORVASC) 10 MG tablet Take 1 tablet (10 mg total) by mouth daily. 01/18/14   Nishant Dhungel, MD  glipiZIDE (GLUCOTROL) 5 MG tablet Take 1 tablet (5 mg total) by mouth daily before breakfast. 01/18/14   Nishant Dhungel, MD  guaifenesin (ROBITUSSIN) 100 MG/5ML syrup Take 200 mg by mouth 3 (three) times daily as needed for cough.    Historical Provider, MD  hydrALAZINE (APRESOLINE) 50 MG tablet Take 1 tablet (50 mg total) by mouth every 8 (eight) hours. 01/18/14   Nishant Dhungel, MD  nicotine (NICODERM CQ - DOSED IN MG/24 HOURS) 21 mg/24hr patch Place 1 patch (21 mg total) onto the skin daily. 01/18/14   Nishant Dhungel, MD  predniSONE (DELTASONE) 20 MG tablet Take 2 tablets (40 mg total) by mouth daily with breakfast. 01/18/14   Nishant Dhungel, MD   BP 145/73  Pulse 79  Temp(Src) 98.1 F (36.7 C) (Oral)  Resp 14  Ht 4\' 11"  (1.499 m)  Wt 177 lb (80.287 kg)  BMI 35.73 kg/m2  SpO2 96% Physical Exam  Nursing note and vitals reviewed. Constitutional: She is oriented to person, place, and time. She appears well-developed and well-nourished.  HENT:  Head: Normocephalic and atraumatic.  Eyes: Conjunctivae and EOM are normal. Pupils are equal, round, and reactive to light.  Neck: Normal range of motion. Neck supple.  Cardiovascular: Normal rate, regular rhythm and normal heart sounds.   Pulmonary/Chest: Effort normal and breath sounds normal.  Abdominal: Soft. Bowel sounds are normal.  Musculoskeletal: Normal range of motion.  Neurological: She is alert and oriented to person, place, and time. She has normal strength. No cranial nerve deficit or sensory deficit.  Coordination and gait normal.  Skin: Skin is warm and dry.  Psychiatric: She has a normal mood and affect. Her behavior is normal.    ED Course  Procedures (including critical care time)  DIAGNOSTIC STUDIES: Oxygen Saturation is 99% on RA, normal by my interpretation.    COORDINATION OF CARE: 8:04 AM Will order head CT and TSH check. Discussed treatment plan with pt at bedside and pt agreed to plan.  10:49 AM Patient updated on imaging and lab results. Advised to follow up with PCP.    Labs Review Labs Reviewed  COMPREHENSIVE METABOLIC PANEL - Abnormal; Notable for the following:    Glucose, Bld 204 (*)    BUN 24 (*)    Creatinine, Ser 1.34 (*)    GFR calc non Af Amer 43 (*)    GFR calc Af Amer 50 (*)    All other components within normal limits  URINALYSIS, ROUTINE W REFLEX MICROSCOPIC - Abnormal; Notable for the following:    Leukocytes, UA SMALL (*)    All other components within normal limits  CBG MONITORING, ED - Abnormal; Notable for the following:    Glucose-Capillary 175 (*)    All other components within normal limits  CBC WITH DIFFERENTIAL  URINE MICROSCOPIC-ADD ON  TSH    Imaging Review Dg Chest 2 View  02/20/2014   CLINICAL DATA:  Left side arm numbness face and neck numbness since 02/03/2014  EXAM: CHEST  2 VIEW  COMPARISON:  DG CHEST 2 VIEW dated 01/14/2014  FINDINGS: Heart size upper normal.  Vascular pattern normal.  Lungs clear.  IMPRESSION: No acute findings   Electronically Signed   By: Skipper Cliche M.D.   On: 02/20/2014 08:29   Ct Head Wo Contrast  02/20/2014   CLINICAL DATA:  Left facial numbness  EXAM: CT HEAD WITHOUT CONTRAST  TECHNIQUE: Contiguous axial images were obtained from the base of the skull through the vertex without intravenous contrast. Study was obtained within 24 hr of patient's arrival at the emergency department.  COMPARISON:  None.  FINDINGS: The ventricles are normal in size and configuration. There is no mass, hemorrhage,  extra-axial fluid collection, or midline shift. There is mild patchy small vessel disease in the centra semiovale bilaterally. Elsewhere gray-white compartments appear normal. There is no demonstrable acute infarct. Bony calvarium appears intact. The mastoid air cells are clear.  IMPRESSION: Mild periventricular small vessel disease. No intracranial mass, hemorrhage, or acute appearing infarct.   Electronically Signed   By: Lowella Grip M.D.   On: 02/20/2014 08:51     EKG Interpretation   Date/Time:  Wednesday February 20 2014 08:04:43 EDT Ventricular Rate:  84 PR Interval:  168 QRS Duration: 88 QT Interval:  386 QTC Calculation: 456 R Axis:   18 Text Interpretation:  Normal sinus rhythm Nonspecific T wave abnormality  Abnormal ECG  When compared with ECG of 14-Jan-2014 14:16, No significant  change was found Confirmed by Dolph Tavano  MD, Sankalp Ferrell ([id number]) on 02/20/2014 8:37:03  AM      MDM   Final diagnoses:  Bilateral leg numbness    Uncertain etiology of patient's symptom complex. She is ambulatory without neurological deficits. Screening tests negative with the exception of a mildly elevated glucose. Will get primary care followup this week  I personally performed the services described in this documentation, which was scribed in my presence. The recorded information has been reviewed and is accurate.   Nat Christen, MD 02/20/14 1106

## 2014-02-20 NOTE — Discharge Instructions (Signed)
Followup your primary care Dr. tomorrow. Thyroid tests pending.

## 2014-04-03 ENCOUNTER — Other Ambulatory Visit (HOSPITAL_COMMUNITY): Payer: Self-pay | Admitting: Family Medicine

## 2014-04-03 DIAGNOSIS — Z0189 Encounter for other specified special examinations: Principal | ICD-10-CM

## 2014-04-03 DIAGNOSIS — Z789 Other specified health status: Secondary | ICD-10-CM

## 2014-04-04 ENCOUNTER — Ambulatory Visit (HOSPITAL_COMMUNITY)
Admission: RE | Admit: 2014-04-04 | Discharge: 2014-04-04 | Disposition: A | Discharge status: Home / Self Care | Payer: BC Managed Care – PPO | Source: Ambulatory Visit | Attending: Family Medicine | Admitting: Family Medicine

## 2014-04-04 ENCOUNTER — Encounter (HOSPITAL_COMMUNITY): Payer: Self-pay

## 2014-04-04 ENCOUNTER — Other Ambulatory Visit (HOSPITAL_COMMUNITY): Payer: Self-pay | Admitting: Family Medicine

## 2014-04-04 ENCOUNTER — Encounter (HOSPITAL_COMMUNITY)
Admission: RE | Admit: 2014-04-04 | Discharge: 2014-04-04 | Disposition: A | Discharge status: Home / Self Care | Payer: BC Managed Care – PPO | Source: Ambulatory Visit | Attending: Family Medicine | Admitting: Family Medicine

## 2014-04-04 DIAGNOSIS — J4489 Other specified chronic obstructive pulmonary disease: Secondary | ICD-10-CM | POA: Insufficient documentation

## 2014-04-04 DIAGNOSIS — Z87891 Personal history of nicotine dependence: Secondary | ICD-10-CM | POA: Insufficient documentation

## 2014-04-04 DIAGNOSIS — Z0189 Encounter for other specified special examinations: Secondary | ICD-10-CM

## 2014-04-04 DIAGNOSIS — J449 Chronic obstructive pulmonary disease, unspecified: Secondary | ICD-10-CM | POA: Insufficient documentation

## 2014-04-04 DIAGNOSIS — R0602 Shortness of breath: Secondary | ICD-10-CM

## 2014-04-04 DIAGNOSIS — R7989 Other specified abnormal findings of blood chemistry: Secondary | ICD-10-CM

## 2014-04-04 DIAGNOSIS — I10 Essential (primary) hypertension: Secondary | ICD-10-CM | POA: Insufficient documentation

## 2014-04-04 DIAGNOSIS — R079 Chest pain, unspecified: Secondary | ICD-10-CM | POA: Diagnosis not present

## 2014-04-04 DIAGNOSIS — Z789 Other specified health status: Secondary | ICD-10-CM

## 2014-04-04 LAB — POCT I-STAT CREATININE: Creatinine, Ser: 1.7 mg/dL — ABNORMAL HIGH (ref 0.50–1.10)

## 2014-04-04 MED ORDER — IOHEXOL 350 MG/ML SOLN
100.0000 mL | Freq: Once | INTRAVENOUS | Status: AC | PRN
  Administered 2014-04-04: 100 mL via INTRAVENOUS

## 2014-04-04 MED ORDER — TECHNETIUM TC 99M DIETHYLENETRIAME-PENTAACETIC ACID
40.0000 | Freq: Once | INTRAVENOUS | Status: AC | PRN
  Administered 2014-04-04: 40 via RESPIRATORY_TRACT

## 2014-04-04 MED ORDER — TECHNETIUM TO 99M ALBUMIN AGGREGATED
6.0000 | Freq: Once | INTRAVENOUS | Status: AC | PRN
  Administered 2014-04-04: 6 via INTRAVENOUS

## 2014-06-06 IMAGING — CT CT HEAD W/O CM
1 series · 16 of 30 positions shown, 20 images · non-contrast
Comparison: None.

CLINICAL DATA: Left facial numbness

EXAM:
CT HEAD WITHOUT CONTRAST
TECHNIQUE: Contiguous axial images were obtained from the base of the skull
through the vertex without intravenous contrast. Study was obtained
within 24 hr of patient's arrival at the emergency department.

[Series 2: headseq 4.8 h37s · axial · 0.43mm/px · z∈[+1098,+1258]mm · 16 of 36 slices shown, 20 images]
[im 2/36  brain]
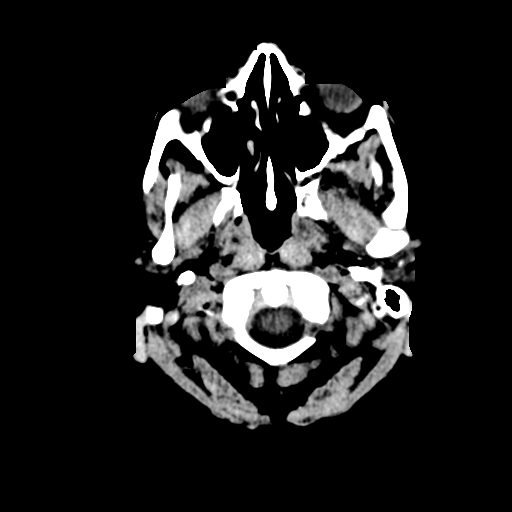
[im 2/36  bone]
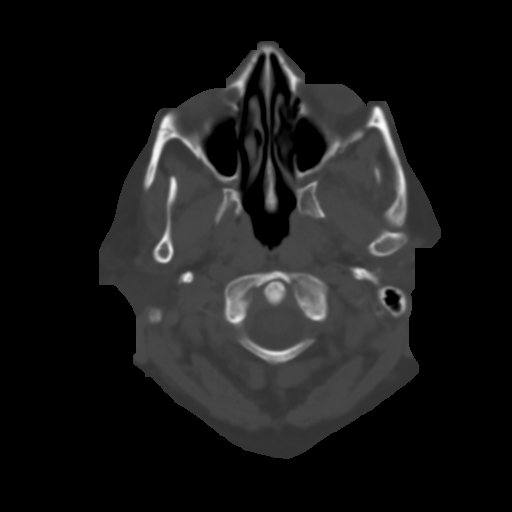
[im 4/36  brain]
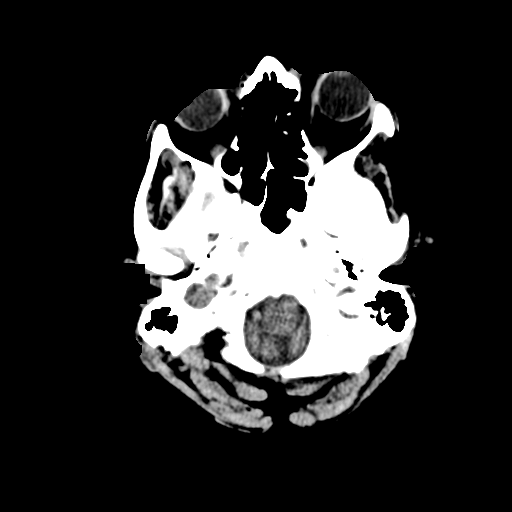
[im 7/36  brain]
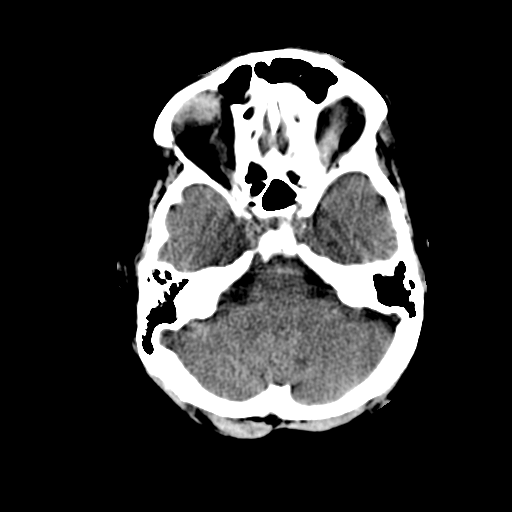
[im 9/36  brain]
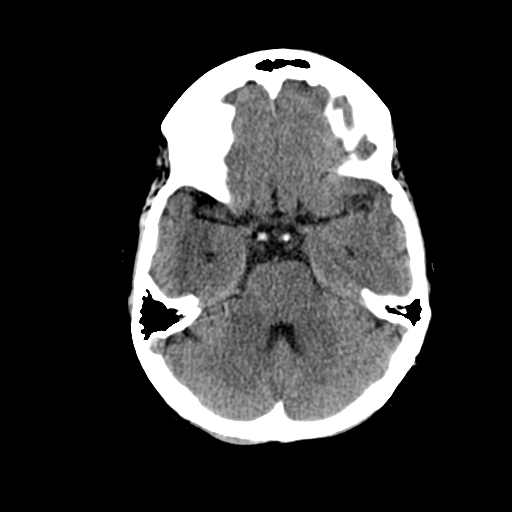
[im 10/36  brain]
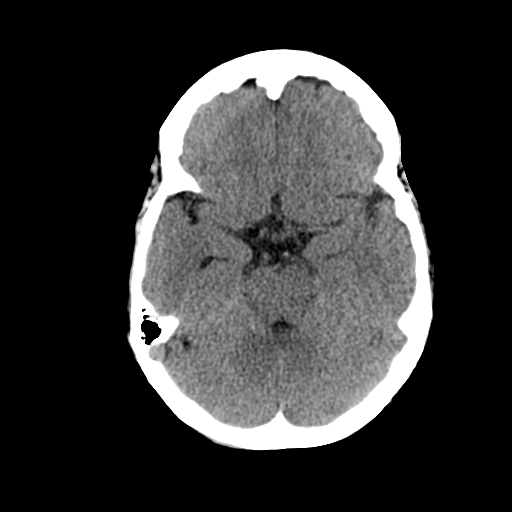
[im 10/36  bone]
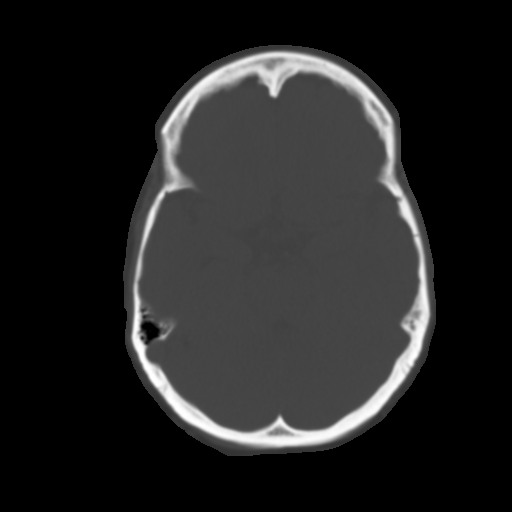
[im 13/36  brain]
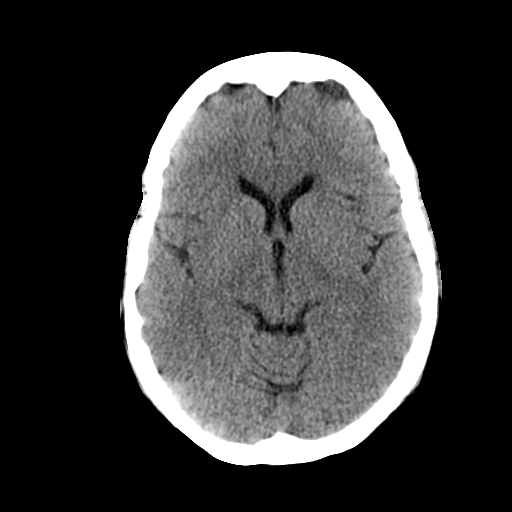
[im 15/36  brain]
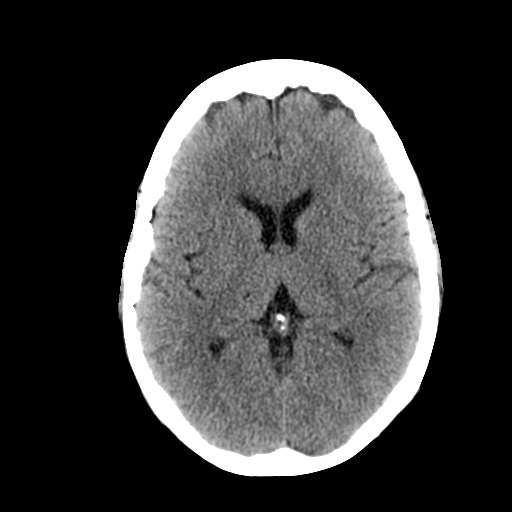
[im 17/36  brain]
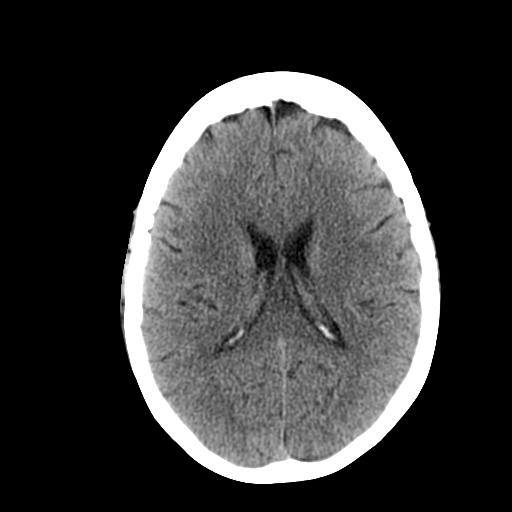
[im 19/36  brain]
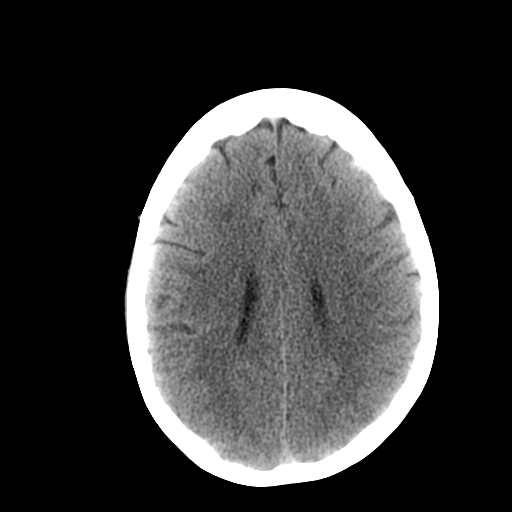
[im 19/36  bone]
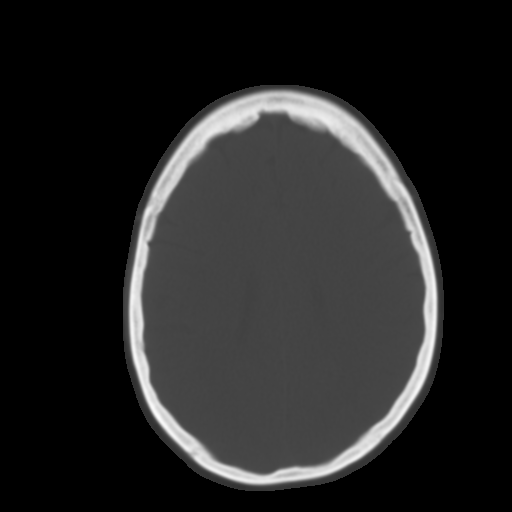
[im 21/36  brain]
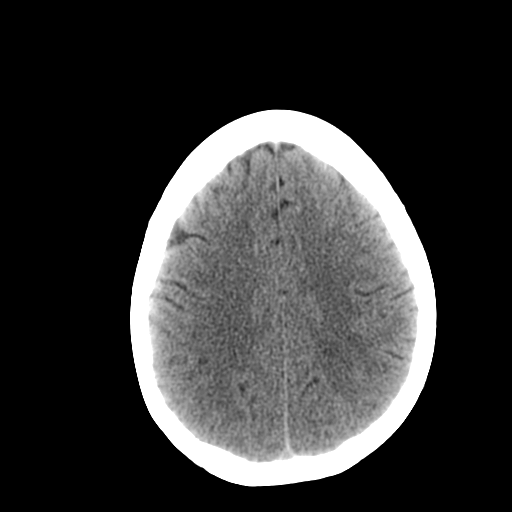
[im 23/36  brain]
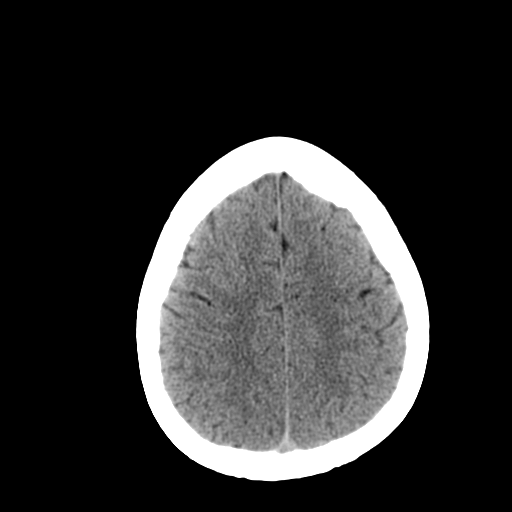
[im 26/36  brain]
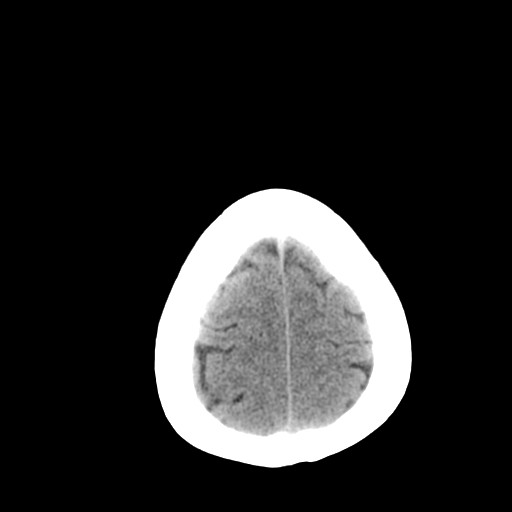
[im 27/36  brain]
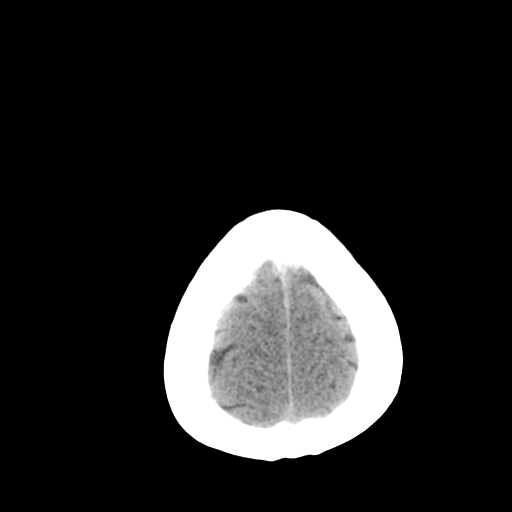
[im 27/36  bone]
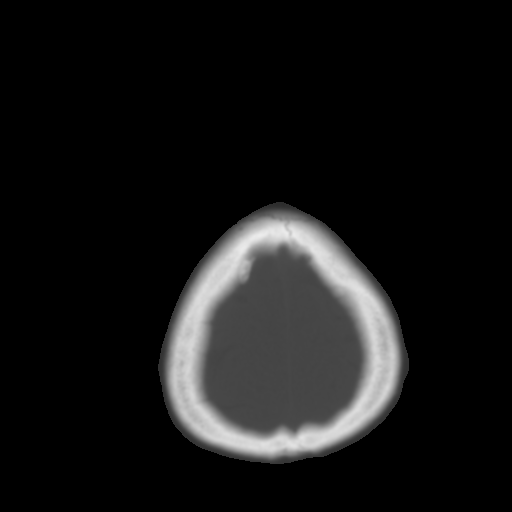
[im 29/36  brain]
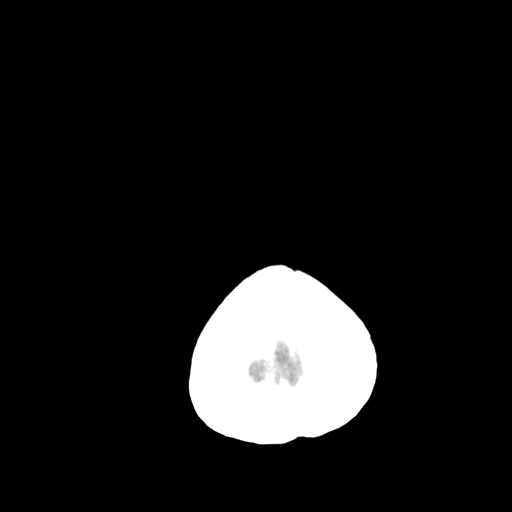
[im 32/36  brain]
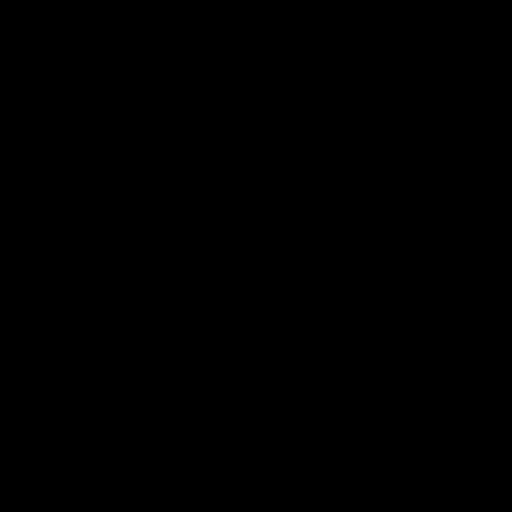
[im 34/36  brain]
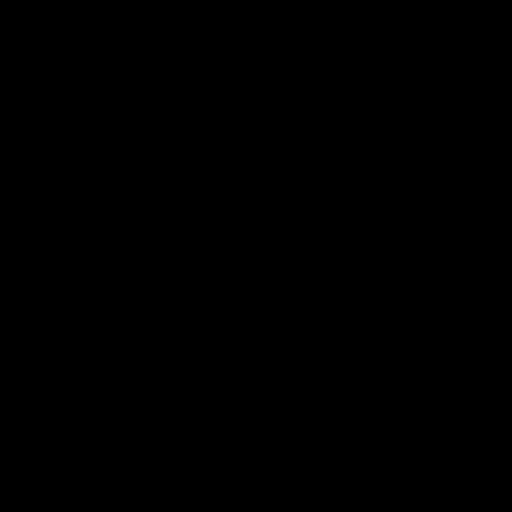

[16 of 30 positions shown; findings below may reference images not displayed]

FINDINGS: The ventricles are normal in size and configuration. There is no
mass, hemorrhage, extra-axial fluid collection, or midline shift.
There is mild patchy small vessel disease in the centra semiovale
bilaterally. Elsewhere gray-white compartments appear normal. There
is no demonstrable acute infarct. Bony calvarium appears intact. The
mastoid air cells are clear.
IMPRESSION: Mild periventricular small vessel disease. No intracranial mass,
hemorrhage, or acute appearing infarct.

## 2014-09-16 ENCOUNTER — Other Ambulatory Visit (HOSPITAL_COMMUNITY): Payer: Self-pay | Admitting: Respiratory Therapy

## 2014-09-16 DIAGNOSIS — J441 Chronic obstructive pulmonary disease with (acute) exacerbation: Secondary | ICD-10-CM

## 2014-09-26 ENCOUNTER — Ambulatory Visit (HOSPITAL_COMMUNITY)
Admission: RE | Admit: 2014-09-26 | Discharge: 2014-09-26 | Disposition: A | Discharge status: Home / Self Care | Payer: BC Managed Care – PPO | Source: Ambulatory Visit | Attending: Pulmonary Disease | Admitting: Pulmonary Disease

## 2014-09-26 DIAGNOSIS — J441 Chronic obstructive pulmonary disease with (acute) exacerbation: Secondary | ICD-10-CM | POA: Diagnosis present

## 2014-09-26 MED ORDER — ALBUTEROL SULFATE (2.5 MG/3ML) 0.083% IN NEBU
2.5000 mg | INHALATION_SOLUTION | Freq: Once | RESPIRATORY_TRACT | Status: AC
  Administered 2014-09-26: 2.5 mg via RESPIRATORY_TRACT

## 2014-10-06 LAB — PULMONARY FUNCTION TEST
DL/VA % PRED: 107 %
DL/VA: 4.4 ml/min/mmHg/L
DLCO UNC % PRED: 67 %
DLCO cor % pred: 67 %
DLCO cor: 11.8 ml/min/mmHg
DLCO unc: 11.8 ml/min/mmHg
FEF 25-75 PRE: 1.64 L/s
FEF 25-75 Post: 2.84 L/sec
FEF2575-%Change-Post: 73 %
FEF2575-%PRED-POST: 136 %
FEF2575-%Pred-Pre: 78 %
FEV1-%Change-Post: 18 %
FEV1-%PRED-PRE: 80 %
FEV1-%Pred-Post: 95 %
FEV1-PRE: 1.69 L
FEV1-Post: 2.01 L
FEV1FVC-%Change-Post: 14 %
FEV1FVC-%Pred-Pre: 102 %
FEV6-%CHANGE-POST: 3 %
FEV6-%Pred-Post: 83 %
FEV6-%Pred-Pre: 80 %
FEV6-Post: 2.21 L
FEV6-Pre: 2.13 L
FEV6FVC-%PRED-PRE: 104 %
FEV6FVC-%Pred-Post: 104 %
FVC-%Change-Post: 3 %
FVC-%Pred-Post: 80 %
FVC-%Pred-Pre: 77 %
FVC-Post: 2.21 L
FVC-Pre: 2.13 L
POST FEV1/FVC RATIO: 91 %
Post FEV6/FVC ratio: 100 %
Pre FEV1/FVC ratio: 80 %
Pre FEV6/FVC Ratio: 100 %
RV % PRED: 97 %
RV: 1.69 L
TLC % PRED: 83 %
TLC: 3.57 L

## 2014-10-30 ENCOUNTER — Other Ambulatory Visit (HOSPITAL_COMMUNITY): Payer: Self-pay | Admitting: Pulmonary Disease

## 2014-10-30 DIAGNOSIS — J398 Other specified diseases of upper respiratory tract: Secondary | ICD-10-CM

## 2014-11-05 ENCOUNTER — Ambulatory Visit (HOSPITAL_COMMUNITY)
Admission: RE | Admit: 2014-11-05 | Discharge: 2014-11-05 | Disposition: A | Discharge status: Home / Self Care | Payer: BC Managed Care – PPO | Source: Ambulatory Visit | Attending: Pulmonary Disease | Admitting: Pulmonary Disease

## 2014-11-05 DIAGNOSIS — J398 Other specified diseases of upper respiratory tract: Secondary | ICD-10-CM | POA: Diagnosis not present

## 2014-11-05 DIAGNOSIS — R942 Abnormal results of pulmonary function studies: Secondary | ICD-10-CM | POA: Diagnosis not present

## 2014-11-05 DIAGNOSIS — R0609 Other forms of dyspnea: Secondary | ICD-10-CM | POA: Diagnosis not present

## 2014-11-05 LAB — POCT I-STAT CREATININE: Creatinine, Ser: 1.5 mg/dL — ABNORMAL HIGH (ref 0.50–1.10)

## 2014-11-05 MED ORDER — IOHEXOL 300 MG/ML  SOLN
80.0000 mL | Freq: Once | INTRAMUSCULAR | Status: AC | PRN
  Administered 2014-11-05: 60 mL via INTRAVENOUS

## 2015-05-14 ENCOUNTER — Other Ambulatory Visit (HOSPITAL_COMMUNITY): Payer: Self-pay | Admitting: Nephrology

## 2015-05-14 DIAGNOSIS — N184 Chronic kidney disease, stage 4 (severe): Secondary | ICD-10-CM

## 2015-05-19 ENCOUNTER — Ambulatory Visit (HOSPITAL_COMMUNITY)
Admission: RE | Admit: 2015-05-19 | Discharge: 2015-05-19 | Disposition: A | Discharge status: Home / Self Care | Payer: BLUE CROSS/BLUE SHIELD | Source: Ambulatory Visit | Attending: Nephrology | Admitting: Nephrology

## 2015-05-19 ENCOUNTER — Ambulatory Visit (HOSPITAL_COMMUNITY): Payer: Self-pay

## 2015-05-19 DIAGNOSIS — N189 Chronic kidney disease, unspecified: Secondary | ICD-10-CM | POA: Insufficient documentation

## 2015-05-19 DIAGNOSIS — N184 Chronic kidney disease, stage 4 (severe): Secondary | ICD-10-CM

## 2015-09-10 ENCOUNTER — Emergency Department (HOSPITAL_COMMUNITY)
Admission: EM | Admit: 2015-09-10 | Discharge: 2015-09-10 | Disposition: A | Discharge status: Home / Self Care | Payer: BLUE CROSS/BLUE SHIELD | Attending: Emergency Medicine | Admitting: Emergency Medicine

## 2015-09-10 ENCOUNTER — Emergency Department (HOSPITAL_COMMUNITY): Payer: BLUE CROSS/BLUE SHIELD

## 2015-09-10 ENCOUNTER — Encounter (HOSPITAL_COMMUNITY): Payer: Self-pay | Admitting: Emergency Medicine

## 2015-09-10 DIAGNOSIS — Z7952 Long term (current) use of systemic steroids: Secondary | ICD-10-CM | POA: Insufficient documentation

## 2015-09-10 DIAGNOSIS — R600 Localized edema: Secondary | ICD-10-CM | POA: Insufficient documentation

## 2015-09-10 DIAGNOSIS — I509 Heart failure, unspecified: Secondary | ICD-10-CM | POA: Diagnosis not present

## 2015-09-10 DIAGNOSIS — I10 Essential (primary) hypertension: Secondary | ICD-10-CM | POA: Insufficient documentation

## 2015-09-10 DIAGNOSIS — R609 Edema, unspecified: Secondary | ICD-10-CM

## 2015-09-10 DIAGNOSIS — F17210 Nicotine dependence, cigarettes, uncomplicated: Secondary | ICD-10-CM | POA: Insufficient documentation

## 2015-09-10 DIAGNOSIS — Z79899 Other long term (current) drug therapy: Secondary | ICD-10-CM | POA: Insufficient documentation

## 2015-09-10 DIAGNOSIS — R0602 Shortness of breath: Secondary | ICD-10-CM | POA: Diagnosis present

## 2015-09-10 DIAGNOSIS — Z87448 Personal history of other diseases of urinary system: Secondary | ICD-10-CM | POA: Insufficient documentation

## 2015-09-10 DIAGNOSIS — J441 Chronic obstructive pulmonary disease with (acute) exacerbation: Secondary | ICD-10-CM | POA: Insufficient documentation

## 2015-09-10 DIAGNOSIS — Z7982 Long term (current) use of aspirin: Secondary | ICD-10-CM | POA: Insufficient documentation

## 2015-09-10 DIAGNOSIS — E119 Type 2 diabetes mellitus without complications: Secondary | ICD-10-CM | POA: Diagnosis not present

## 2015-09-10 HISTORY — DX: Disorder of kidney and ureter, unspecified: N28.9

## 2015-09-10 LAB — BASIC METABOLIC PANEL
Anion gap: 8 (ref 5–15)
BUN: 24 mg/dL — AB (ref 6–20)
CO2: 23 mmol/L (ref 22–32)
CREATININE: 1.69 mg/dL — AB (ref 0.44–1.00)
Calcium: 8.9 mg/dL (ref 8.9–10.3)
Chloride: 107 mmol/L (ref 101–111)
GFR, EST AFRICAN AMERICAN: 37 mL/min — AB (ref >60)
GFR, EST NON AFRICAN AMERICAN: 32 mL/min — AB (ref >60)
Glucose, Bld: 232 mg/dL — ABNORMAL HIGH (ref 65–99)
Potassium: 3.6 mmol/L (ref 3.5–5.1)
SODIUM: 138 mmol/L (ref 135–145)

## 2015-09-10 LAB — CBC WITH DIFFERENTIAL/PLATELET
BASOS PCT: 0 %
Basophils Absolute: 0 10*3/uL (ref 0.0–0.1)
EOS ABS: 0.2 10*3/uL (ref 0.0–0.7)
EOS PCT: 3 %
HCT: 31.9 % — ABNORMAL LOW (ref 36.0–46.0)
HEMOGLOBIN: 10.3 g/dL — AB (ref 12.0–15.0)
LYMPHS ABS: 2 10*3/uL (ref 0.7–4.0)
Lymphocytes Relative: 29 %
MCH: 28 pg (ref 26.0–34.0)
MCHC: 32.3 g/dL (ref 30.0–36.0)
MCV: 86.7 fL (ref 78.0–100.0)
Monocytes Absolute: 0.5 10*3/uL (ref 0.1–1.0)
Monocytes Relative: 8 %
NEUTROS PCT: 60 %
Neutro Abs: 4.1 10*3/uL (ref 1.7–7.7)
PLATELETS: 253 10*3/uL (ref 150–400)
RBC: 3.68 MIL/uL — AB (ref 3.87–5.11)
RDW: 13.8 % (ref 11.5–15.5)
WBC: 6.9 10*3/uL (ref 4.0–10.5)

## 2015-09-10 LAB — BRAIN NATRIURETIC PEPTIDE: B NATRIURETIC PEPTIDE 5: 51 pg/mL (ref 0.0–100.0)

## 2015-09-10 LAB — TROPONIN I

## 2015-09-10 MED ORDER — PREDNISONE 20 MG PO TABS: 20.0000 mg | ORAL_TABLET | Freq: Two times a day (BID) | ORAL | Status: DC

## 2015-09-10 MED ORDER — FUROSEMIDE 20 MG PO TABS: 20.0000 mg | ORAL_TABLET | Freq: Every day | ORAL | Status: DC | PRN

## 2015-09-10 MED ORDER — ALBUTEROL SULFATE (2.5 MG/3ML) 0.083% IN NEBU
2.5000 mg | INHALATION_SOLUTION | RESPIRATORY_TRACT | Status: DC | PRN
  Administered 2015-09-10 (×2): 2.5 mg via RESPIRATORY_TRACT
  Filled 2015-09-10 (×2): qty 3

## 2015-09-10 MED ORDER — METHYLPREDNISOLONE SODIUM SUCC 125 MG IJ SOLR
125.0000 mg | Freq: Once | INTRAMUSCULAR | Status: AC
  Administered 2015-09-10: 125 mg via INTRAVENOUS
  Filled 2015-09-10: qty 2

## 2015-09-10 NOTE — ED Notes (Signed)
Patient reports of shortness of breath off and on since this morning. States she took 2 breathing tx within the last hour and no relief. Reports of mid chest tightness that started at approximately 1800. NADN.

## 2015-09-10 NOTE — ED Notes (Signed)
Patient verbalizes understanding of discharge instructions, prescription medications, home care and follow up care. Patient ambulatory out of department at this time with family. 

## 2015-09-10 NOTE — Discharge Instructions (Signed)
Asthma, Acute Bronchospasm °Acute bronchospasm caused by asthma is also referred to as an asthma attack. Bronchospasm means your air passages become narrowed. The narrowing is caused by inflammation and tightening of the muscles in the air tubes (bronchi) in your lungs. This can make it hard to breathe or cause you to wheeze and cough. °CAUSES °Possible triggers are: °· Animal dander from the skin, hair, or feathers of animals. °· Dust mites contained in house dust. °· Cockroaches. °· Pollen from trees or grass. °· Mold. °· Cigarette or tobacco smoke. °· Air pollutants such as dust, household cleaners, hair sprays, aerosol sprays, paint fumes, strong chemicals, or strong odors. °· Cold air or weather changes. Cold air may trigger inflammation. Winds increase molds and pollens in the air. °· Strong emotions such as crying or laughing hard. °· Stress. °· Certain medicines such as aspirin or beta-blockers. °· Sulfites in foods and drinks, such as dried fruits and wine. °· Infections or inflammatory conditions, such as a flu, cold, or inflammation of the nasal membranes (rhinitis). °· Gastroesophageal reflux disease (GERD). GERD is a condition where stomach acid backs up into your esophagus. °· Exercise or strenuous activity. °SIGNS AND SYMPTOMS  °· Wheezing. °· Excessive coughing, particularly at night. °· Chest tightness. °· Shortness of breath. °DIAGNOSIS  °Your health care provider will ask you about your medical history and perform a physical exam. A chest X-ray or blood testing may be performed to look for other causes of your symptoms or other conditions that may have triggered your asthma attack.  °TREATMENT  °Treatment is aimed at reducing inflammation and opening up the airways in your lungs.  Most asthma attacks are treated with inhaled medicines. These include quick relief or rescue medicines (such as bronchodilators) and controller medicines (such as inhaled corticosteroids). These medicines are sometimes  given through an inhaler or a nebulizer. Systemic steroid medicine taken by mouth or given through an IV tube also can be used to reduce the inflammation when an attack is moderate or severe. Antibiotic medicines are only used if a bacterial infection is present.  °HOME CARE INSTRUCTIONS  °· Rest. °· Drink plenty of liquids. This helps the mucus to remain thin and be easily coughed up. Only use caffeine in moderation and do not use alcohol until you have recovered from your illness. °· Do not smoke. Avoid being exposed to secondhand smoke. °· You play a critical role in keeping yourself in good health. Avoid exposure to things that cause you to wheeze or to have breathing problems. °· Keep your medicines up-to-date and available. Carefully follow your health care provider's treatment plan. °· Take your medicine exactly as prescribed. °· When pollen or pollution is bad, keep windows closed and use an air conditioner or go to places with air conditioning. °· Asthma requires careful medical care. See your health care provider for a follow-up as advised. If you are more than [redacted] weeks pregnant and you were prescribed any new medicines, let your obstetrician know about the visit and how you are doing. Follow up with your health care provider as directed. °· After you have recovered from your asthma attack, make an appointment with your outpatient doctor to talk about ways to reduce the likelihood of future attacks. If you do not have a doctor who manages your asthma, make an appointment with a primary care doctor to discuss your asthma. °SEEK IMMEDIATE MEDICAL CARE IF:  °· You are getting worse. °· You have trouble breathing. If severe, call your local   emergency services (911 in the [place]  You develop chest pain or discomfort.  You are vomiting.  You are not able to keep fluids down.  You are coughing up yellow, green, brown, or bloody sputum.  You have a fever and your symptoms suddenly get worse.  You have  trouble swallowing. MAKE SURE YOU:   Understand these instructions.  Will watch your condition.  Will get help right away if you are not doing well or get worse.   This information is not intended to replace advice given to you by your health care provider. Make sure you discuss any questions you have with your health care provider.   Document Released: 02/09/2007 Document Revised: 10/30/2013 Document Reviewed: 05/02/2013 Elsevier Interactive Patient Education 2016 [employer]  Chronic Obstructive Pulmonary Disease Exacerbation Chronic obstructive pulmonary disease (COPD) is a common lung problem. In COPD, the flow of air from the lungs is limited. COPD exacerbations are times that breathing gets worse and you need extra treatment. Without treatment they can be life threatening. If they happen often, your lungs can become more damaged. If your COPD gets worse, your doctor may treat you with:  Medicines.  Oxygen.  Different ways to clear your airway, such as using a mask. HOME CARE  Do not smoke.  Avoid tobacco smoke and other things that bother your lungs.  If given, take your antibiotic medicine as told. Finish the medicine even if you start to feel better.  Only take medicines as told by your doctor.  Drink enough fluids to keep your pee (urine) clear or pale yellow (unless your doctor has told you not to).  Use a cool mist machine (vaporizer).  If you use oxygen or a machine that turns liquid medicine into a mist (nebulizer), continue to use them as told.  Keep up with shots (vaccinations) as told by your doctor.  Exercise regularly.  Eat healthy foods.  Keep all doctor visits as told. GET HELP RIGHT AWAY IF:  You are very short of breath and it gets worse.  You have trouble talking.  You have bad chest pain.  You have blood in your spit (sputum).  You have a fever.  You keep throwing up (vomiting).  You feel weak, or you pass out (faint).  You feel  confused.  You keep getting worse. MAKE SURE YOU:  Understand these instructions.  Will watch your condition.  Will get help right away if you are not doing well or get worse.   This information is not intended to replace advice given to you by your health care provider. Make sure you discuss any questions you have with your health care provider.   Document Released: 10/14/2011 Document Revised: 11/15/2014 Document Reviewed: 06/29/2013 Elsevier Interactive Patient Education [employer].

## 2015-09-10 NOTE — ED Provider Notes (Signed)
CSN: [id number]     Arrival date & time 09/10/15  2055 History  By signing my name below, I, Eustaquio Maize, attest that this documentation has been prepared under the direction and in the presence of Tanna Furry, MD. Electronically Signed: Eustaquio Maize, ED Scribe. 09/10/2015. 9:32 PM.  Chief Complaint  Patient presents with  . Chest Pain  . Shortness of Breath   The history is provided by the patient. No language interpreter was used.     HPI Comments: Rebekah Ray is a 59 y.o. female with hx HTN, DM, and COPD who presents to the Emergency Department complaining of gradual onset, constant, shortness of breath and wheezing that began earlier this morning. Pt has had 2 breathing treatment at 6:30 PM and 7:30 PM (approxiamtely 2 hours ago) without relief. Pt also complains of mid chest tightness that began today. She notes intermittent leg swelling x 1 week, worsening for the past 3 days. Pt was placed on Norvasc approximately 2 months ago after being taken off of Lisinopril due to her renal disorder. Pt states that she was on Norvasc in the past and had leg swelling with that as well. Pt reports similar shortness of breath and leg swelling last year when she was diagnosed with heart failure. Pt was admitted to the hospital for 5 days at that time. She denies congestion, cough, rhinorrhea, fever, chills, or any other associated symptoms. Pt is non smoker.   Past Medical History  Diagnosis Date  . Hypertension   . Diabetes mellitus without complication ([place])   . COPD (chronic obstructive pulmonary disease) ([place])   . Renal disorder    Past Surgical History  Procedure Laterality Date  . Cesarean section    . Tubal ligation    . Breast biopsy     History reviewed. No pertinent family history. Social History  Substance Use Topics  . Smoking status: Current Every Day Smoker    Types: Cigarettes  . Smokeless tobacco: Former Systems developer  . Alcohol Use: No   OB History    No data available      Review of Systems  Constitutional: Negative for fever, chills, appetite change and fatigue.  HENT: Negative for congestion, mouth sores, rhinorrhea and trouble swallowing.   Eyes: Negative for visual disturbance.  Respiratory: Positive for chest tightness, shortness of breath and wheezing. Negative for cough.   Cardiovascular: Positive for leg swelling.  Gastrointestinal: Negative for nausea, diarrhea and abdominal distention.  Endocrine: Negative for polydipsia, polyphagia and polyuria.  Genitourinary: Negative for dysuria, frequency and hematuria.  Musculoskeletal: Negative for gait problem.  Skin: Negative for color change and pallor.  Neurological: Negative for dizziness, syncope and light-headedness.  Hematological: Does not bruise/bleed easily.  Psychiatric/Behavioral: Negative for behavioral problems and confusion.   Allergies  Review of patient's allergies indicates no known allergies.  Home Medications   Prior to Admission medications   Medication Sig Start Date End Date Taking? Authorizing Provider  acetaminophen (TYLENOL) 500 MG tablet Take 500 mg by mouth every 6 (six) hours as needed for mild pain.   Yes Historical Provider, MD  albuterol (PROVENTIL HFA;VENTOLIN HFA) 108 (90 BASE) MCG/ACT inhaler Inhale 2 puffs into the lungs every 6 (six) hours as needed for wheezing or shortness of breath. 01/18/14  Yes Nishant Dhungel, MD  amLODipine (NORVASC) 5 MG tablet Take 5 mg by mouth daily.   Yes Historical Provider, MD  aspirin EC 81 MG tablet Take 81 mg by mouth daily.   Yes Historical  Provider, MD  atorvastatin (LIPITOR) 20 MG tablet Take 20 mg by mouth at bedtime.   Yes Historical Provider, MD  carvedilol (COREG) 12.5 MG tablet Take 12.5 mg by mouth 2 (two) times daily with a meal.   Yes Historical Provider, MD  ferrous sulfate 325 (65 FE) MG tablet Take 325 mg by mouth 2 (two) times a week.   Yes Historical Provider, MD  Fluticasone Furoate-Vilanterol (BREO ELLIPTA) 100-25  MCG/INH AEPB Inhale 1 puff into the lungs daily.   Yes Historical Provider, MD  hydrALAZINE (APRESOLINE) 50 MG tablet Take 1 tablet (50 mg total) by mouth every 8 (eight) hours. 01/18/14  Yes Nishant Dhungel, MD  levothyroxine (SYNTHROID, LEVOTHROID) 50 MCG tablet Take 50 mcg by mouth daily. 02/13/14  Yes Historical Provider, MD  sitaGLIPtin (JANUVIA) 100 MG tablet Take 50 mg by mouth daily.   Yes Historical Provider, MD  Tiotropium Bromide Monohydrate (SPIRIVA RESPIMAT) 2.5 MCG/ACT AERS Inhale 2.5 mcg into the lungs daily.   Yes Historical Provider, MD  amLODipine (NORVASC) 10 MG tablet Take 1 tablet (10 mg total) by mouth daily. 01/18/14   Nishant Dhungel, MD  furosemide (LASIX) 20 MG tablet Take 1 tablet (20 mg total) by mouth daily as needed for fluid. 09/10/15   Tanna Furry, MD  glipiZIDE (GLUCOTROL) 5 MG tablet Take 1 tablet (5 mg total) by mouth daily before breakfast. 01/18/14   Nishant Dhungel, MD  predniSONE (DELTASONE) 20 MG tablet Take 1 tablet (20 mg total) by mouth 2 (two) times daily with a meal. 09/10/15   Tanna Furry, MD   Triage Vitals: BP 138/77 mmHg  Pulse 81  Temp(Src) 97.8 F (36.6 C) (Oral)  Resp 20  Ht 4\' 11"  (1.499 m)  Wt 190 lb (86.183 kg)  BMI 38.35 kg/m2  SpO2 96%   Physical Exam  Constitutional: She is oriented to person, place, and time. She appears well-developed and well-nourished. No distress.  HENT:  Head: Normocephalic.  Eyes: Conjunctivae are normal. Pupils are equal, round, and reactive to light. No scleral icterus.  Neck: Normal range of motion. Neck supple. No thyromegaly present.  Cardiovascular: Normal rate and regular rhythm.  Exam reveals no gallop and no friction rub.   No murmur heard. Pulmonary/Chest: Effort normal. No respiratory distress. She has wheezes. She has rales.  Wheezing in all fields Basilar crackles bilaterally  Abdominal: Soft. Bowel sounds are normal. She exhibits no distension. There is no tenderness. There is no rebound.   Musculoskeletal: Normal range of motion. She exhibits edema.  1+ edema in bilateral lower extremities  Neurological: She is alert and oriented to person, place, and time.  Skin: Skin is warm and dry. No rash noted.  Psychiatric: She has a normal mood and affect. Her behavior is normal.    ED Course  Procedures (including critical care time)  DIAGNOSTIC STUDIES: Oxygen Saturation is 96% on RA, normal by my interpretation.    COORDINATION OF CARE: 9:27 PM-Discussed treatment plan which includes breathing treatment, CBC, BMP, BNP, and Troponin with pt at bedside and pt agreed to plan.   Labs Review Labs Reviewed  CBC WITH DIFFERENTIAL/PLATELET - Abnormal; Notable for the following:    RBC 3.68 (*)    Hemoglobin 10.3 (*)    HCT 31.9 (*)    All other components within normal limits  BASIC METABOLIC PANEL - Abnormal; Notable for the following:    Glucose, Bld 232 (*)    BUN 24 (*)    Creatinine, Ser 1.69 (*)  GFR calc non Af Amer 32 (*)    GFR calc Af Amer 37 (*)    All other components within normal limits  BRAIN NATRIURETIC PEPTIDE  TROPONIN I    Imaging Review No results found. I have personally reviewed and evaluated these lab results as part of my medical decision-making.   EKG Interpretation   Date/Time:  Wednesday September 10 2015 21:11:28 EDT Ventricular Rate:  79 PR Interval:  186 QRS Duration: 86 QT Interval:  397 QTC Calculation: 455 R Axis:   46 Text Interpretation:  Sinus rhythm Low voltage, precordial leads ED  PHYSICIAN INTERPRETATION AVAILABLE IN [facility] Confirmed by TEST,  Record ([id number]) on 09/11/2015 7:11:10 AM      MDM   Final diagnoses:  COPD exacerbation ([place])  Dependent edema   Patient improving during department stay. Reassuring studies. Appropriate for discharge home    Tanna Furry, MD 10/02/15 [phone]

## 2015-12-08 ENCOUNTER — Other Ambulatory Visit: Payer: Self-pay | Admitting: Family Medicine

## 2015-12-08 ENCOUNTER — Ambulatory Visit
Admission: RE | Admit: 2015-12-08 | Discharge: 2015-12-08 | Disposition: A | Discharge status: Home / Self Care | Payer: BLUE CROSS/BLUE SHIELD | Source: Ambulatory Visit | Attending: Family Medicine | Admitting: Family Medicine

## 2015-12-08 DIAGNOSIS — M25551 Pain in right hip: Secondary | ICD-10-CM

## 2016-01-23 ENCOUNTER — Emergency Department (HOSPITAL_COMMUNITY)
Admission: EM | Admit: 2016-01-23 | Discharge: 2016-01-23 | Disposition: A | Discharge status: Home / Self Care | Payer: BLUE CROSS/BLUE SHIELD | Attending: Emergency Medicine | Admitting: Emergency Medicine

## 2016-01-23 ENCOUNTER — Encounter (HOSPITAL_COMMUNITY): Payer: Self-pay | Admitting: *Deleted

## 2016-01-23 ENCOUNTER — Emergency Department (HOSPITAL_COMMUNITY): Payer: BLUE CROSS/BLUE SHIELD

## 2016-01-23 DIAGNOSIS — I10 Essential (primary) hypertension: Secondary | ICD-10-CM | POA: Insufficient documentation

## 2016-01-23 DIAGNOSIS — Z7982 Long term (current) use of aspirin: Secondary | ICD-10-CM | POA: Insufficient documentation

## 2016-01-23 DIAGNOSIS — J441 Chronic obstructive pulmonary disease with (acute) exacerbation: Secondary | ICD-10-CM | POA: Diagnosis not present

## 2016-01-23 DIAGNOSIS — Z792 Long term (current) use of antibiotics: Secondary | ICD-10-CM | POA: Diagnosis not present

## 2016-01-23 DIAGNOSIS — Z79899 Other long term (current) drug therapy: Secondary | ICD-10-CM | POA: Insufficient documentation

## 2016-01-23 DIAGNOSIS — Z7984 Long term (current) use of oral hypoglycemic drugs: Secondary | ICD-10-CM | POA: Insufficient documentation

## 2016-01-23 DIAGNOSIS — E119 Type 2 diabetes mellitus without complications: Secondary | ICD-10-CM | POA: Insufficient documentation

## 2016-01-23 DIAGNOSIS — Z87891 Personal history of nicotine dependence: Secondary | ICD-10-CM | POA: Insufficient documentation

## 2016-01-23 DIAGNOSIS — R0602 Shortness of breath: Secondary | ICD-10-CM | POA: Diagnosis present

## 2016-01-23 HISTORY — DX: Disorder of thyroid, unspecified: E07.9

## 2016-01-23 LAB — BRAIN NATRIURETIC PEPTIDE: B NATRIURETIC PEPTIDE 5: 45 pg/mL (ref 0.0–100.0)

## 2016-01-23 LAB — CBC WITH DIFFERENTIAL/PLATELET
BASOS ABS: 0 10*3/uL (ref 0.0–0.1)
BASOS PCT: 1 %
Eosinophils Absolute: 0.1 10*3/uL (ref 0.0–0.7)
Eosinophils Relative: 2 %
HEMATOCRIT: 35 % — AB (ref 36.0–46.0)
HEMOGLOBIN: 11.5 g/dL — AB (ref 12.0–15.0)
LYMPHS PCT: 33 %
Lymphs Abs: 1.4 10*3/uL (ref 0.7–4.0)
MCH: 28.3 pg (ref 26.0–34.0)
MCHC: 32.9 g/dL (ref 30.0–36.0)
MCV: 86 fL (ref 78.0–100.0)
MONO ABS: 0.5 10*3/uL (ref 0.1–1.0)
Monocytes Relative: 11 %
NEUTROS ABS: 2.3 10*3/uL (ref 1.7–7.7)
NEUTROS PCT: 53 %
Platelets: 247 10*3/uL (ref 150–400)
RBC: 4.07 MIL/uL (ref 3.87–5.11)
RDW: 13.8 % (ref 11.5–15.5)
WBC: 4.3 10*3/uL (ref 4.0–10.5)

## 2016-01-23 LAB — TROPONIN I: Troponin I: <0.03 ng/mL (ref <0.031)

## 2016-01-23 LAB — BASIC METABOLIC PANEL
ANION GAP: 11 (ref 5–15)
BUN: 24 mg/dL — ABNORMAL HIGH (ref 6–20)
CALCIUM: 8.7 mg/dL — AB (ref 8.9–10.3)
CO2: 24 mmol/L (ref 22–32)
Chloride: 102 mmol/L (ref 101–111)
Creatinine, Ser: 1.9 mg/dL — ABNORMAL HIGH (ref 0.44–1.00)
GFR, EST AFRICAN AMERICAN: 32 mL/min — AB (ref >60)
GFR, EST NON AFRICAN AMERICAN: 28 mL/min — AB (ref >60)
GLUCOSE: 238 mg/dL — AB (ref 65–99)
POTASSIUM: 3.5 mmol/L (ref 3.5–5.1)
Sodium: 137 mmol/L (ref 135–145)

## 2016-01-23 MED ORDER — METHYLPREDNISOLONE SODIUM SUCC 125 MG IJ SOLR
125.0000 mg | Freq: Once | INTRAMUSCULAR | Status: AC
  Administered 2016-01-23: 125 mg via INTRAVENOUS
  Filled 2016-01-23: qty 2

## 2016-01-23 MED ORDER — PREDNISONE 20 MG PO TABS: 40.0000 mg | ORAL_TABLET | Freq: Every day | ORAL | Status: DC

## 2016-01-23 MED ORDER — IPRATROPIUM BROMIDE 0.02 % IN SOLN
1.0000 mg | Freq: Once | RESPIRATORY_TRACT | Status: AC
  Administered 2016-01-23: 1 mg via RESPIRATORY_TRACT
  Filled 2016-01-23: qty 5

## 2016-01-23 MED ORDER — ALBUTEROL (5 MG/ML) CONTINUOUS INHALATION SOLN
10.0000 mg/h | INHALATION_SOLUTION | Freq: Once | RESPIRATORY_TRACT | Status: AC
  Administered 2016-01-23: 10 mg/h via RESPIRATORY_TRACT
  Filled 2016-01-23: qty 20

## 2016-01-23 NOTE — ED Notes (Signed)
Dr. McManus at bedside. 

## 2016-01-23 NOTE — ED Notes (Signed)
NT Brooke ambulated patient with pulse ox. Pt's SPO2 dropped from 95% to 91%. HR in 80s. Gait steady and even. No distress noted.

## 2016-01-23 NOTE — ED Provider Notes (Signed)
CSN: [id number]     Arrival date & time 01/23/16  0807 History   First MD Initiated Contact with Patient 01/23/16 [phone]     Chief Complaint  Patient presents with  . Shortness of Breath      HPI Pt was seen at 0845.  Per pt and her family, c/o gradual onset and worsening of persistent cough, wheezing and SOB for the past 4 days.  Describes her symptoms as "my COPD is acting up."  Has been using home MDI and nebs with transient relief. Pt states her Pulm Dr. Luan Pulling started her on abx 2 days ago, without change in her symptoms.  Denies CP/palpitations, no back pain, no abd pain, no N/V/D, no fevers, no rash.     Past Medical History  Diagnosis Date  . Hypertension   . Diabetes mellitus without complication ([place])   . COPD (chronic obstructive pulmonary disease) ([place])   . Renal disorder   . Thyroid disease    Past Surgical History  Procedure Laterality Date  . Cesarean section    . Tubal ligation    . Breast biopsy     No family history on file. Social History  Substance Use Topics  . Smoking status: Former Smoker    Types: Cigarettes    Quit date: 01/22/2014  . Smokeless tobacco: Former Systems developer  . Alcohol Use: No    Review of Systems ROS: Statement: All systems negative except as marked or noted in the HPI; Constitutional: Negative for fever and chills. ; ; Eyes: Negative for eye pain, redness and discharge. ; ; ENMT: Negative for ear pain, hoarseness, nasal congestion, sinus pressure and sore throat. ; ; Cardiovascular: Negative for chest pain, palpitations, diaphoresis and peripheral edema. ; ; Respiratory: +cough, wheezing, SOB. Negative for stridor. ; ; Gastrointestinal: Negative for nausea, vomiting, diarrhea, abdominal pain, blood in stool, hematemesis, jaundice and rectal bleeding. . ; ; Genitourinary: Negative for dysuria, flank pain and hematuria. ; ; Musculoskeletal: Negative for back pain and neck pain. Negative for swelling and trauma.; ; Skin: Negative for pruritus,  rash, abrasions, blisters, bruising and skin lesion.; ; Neuro: Negative for headache, lightheadedness and neck stiffness. Negative for weakness, altered level of consciousness , altered mental status, extremity weakness, paresthesias, involuntary movement, seizure and syncope.      Allergies  Review of patient's allergies indicates no known allergies.  Home Medications   Prior to Admission medications   Medication Sig Start Date End Date Taking? Authorizing Provider  acetaminophen (TYLENOL) 500 MG tablet Take 500 mg by mouth every 6 (six) hours as needed for mild pain.    Historical Provider, MD  albuterol (PROVENTIL HFA;VENTOLIN HFA) 108 (90 BASE) MCG/ACT inhaler Inhale 2 puffs into the lungs every 6 (six) hours as needed for wheezing or shortness of breath. 01/18/14   Nishant Dhungel, MD  amLODipine (NORVASC) 10 MG tablet Take 1 tablet (10 mg total) by mouth daily. 01/18/14   Nishant Dhungel, MD  amLODipine (NORVASC) 5 MG tablet Take 5 mg by mouth daily.    Historical Provider, MD  aspirin EC 81 MG tablet Take 81 mg by mouth daily.    Historical Provider, MD  atorvastatin (LIPITOR) 20 MG tablet Take 20 mg by mouth at bedtime.    Historical Provider, MD  carvedilol (COREG) 12.5 MG tablet Take 12.5 mg by mouth 2 (two) times daily with a meal.    Historical Provider, MD  ferrous sulfate 325 (65 FE) MG tablet Take 325 mg by mouth 2 (  two) times a week.    Historical Provider, MD  Fluticasone Furoate-Vilanterol (BREO ELLIPTA) 100-25 MCG/INH AEPB Inhale 1 puff into the lungs daily.    Historical Provider, MD  furosemide (LASIX) 20 MG tablet Take 1 tablet (20 mg total) by mouth daily as needed for fluid. 09/10/15   Tanna Furry, MD  glipiZIDE (GLUCOTROL) 5 MG tablet Take 1 tablet (5 mg total) by mouth daily before breakfast. 01/18/14   Nishant Dhungel, MD  hydrALAZINE (APRESOLINE) 50 MG tablet Take 1 tablet (50 mg total) by mouth every 8 (eight) hours. 01/18/14   Nishant Dhungel, MD  levothyroxine  (SYNTHROID, LEVOTHROID) 50 MCG tablet Take 50 mcg by mouth daily. 02/13/14   Historical Provider, MD  predniSONE (DELTASONE) 20 MG tablet Take 1 tablet (20 mg total) by mouth 2 (two) times daily with a meal. 09/10/15   Tanna Furry, MD  sitaGLIPtin (JANUVIA) 100 MG tablet Take 50 mg by mouth daily.    Historical Provider, MD  Tiotropium Bromide Monohydrate (SPIRIVA RESPIMAT) 2.5 MCG/ACT AERS Inhale 2.5 mcg into the lungs daily.    Historical Provider, MD   BP 111/60 mmHg  Pulse 52  Temp(Src) 97.8 F (36.6 C) (Oral)  Resp 12  Ht 4\' 11"  (1.499 m)  Wt 195 lb (88.451 kg)  BMI 39.36 kg/m2  SpO2 90% Physical Exam  0850: Physical examination:  Nursing notes reviewed; Vital signs and O2 SAT reviewed;  Constitutional: Well developed, Well nourished, Well hydrated, Uncomfortable appearing.; Head:  Normocephalic, atraumatic; Eyes: EOMI, PERRL, No scleral icterus; ENMT: Mouth and pharynx normal, Mucous membranes moist; Neck: Supple, Full range of motion, No lymphadenopathy; Cardiovascular: Regular rate and rhythm, No gallop; Respiratory: Breath sounds diminished & equal bilaterally, scattered faint wheezes.  Speaking long phrases, mild tachypnea, sitting upright; Chest: Nontender, Movement normal; Abdomen: Soft, Nontender, Nondistended, Normal bowel sounds; Genitourinary: No CVA tenderness; Extremities: Pulses normal, No tenderness, No edema, No calf edema or asymmetry.; Neuro: AA&Ox3, Major CN grossly intact.  Speech clear. No gross focal motor or sensory deficits in extremities.; Skin: Color normal, Warm, Dry.   ED Course  Procedures (including critical care time) Labs Review  Imaging Review  I have personally reviewed and evaluated these images and lab results as part of my medical decision-making.   EKG Interpretation   Date/Time:  Friday January 23 2016 08:17:58 EDT Ventricular Rate:  60 PR Interval:  193 QRS Duration: 98 QT Interval:  440 QTC Calculation: 440 R Axis:   64 Text Interpretation:   Sinus rhythm Low voltage, precordial leads Baseline  wander When compared with ECG of 09/10/2015 No significant change was found  Confirmed by [facility]  MD, Nunzio Cory [phone]) on 01/23/2016 9:22:05 AM      MDM  MDM Reviewed: previous chart, nursing note and vitals Reviewed previous: labs and ECG Interpretation: labs, ECG and x-ray     Results for orders placed or performed during the hospital encounter of [id number]  Basic metabolic panel  Result Value Ref Range   Sodium 137 135 - 145 mmol/L   Potassium 3.5 3.5 - 5.1 mmol/L   Chloride 102 101 - 111 mmol/L   CO2 24 22 - 32 mmol/L   Glucose, Bld 238 (H) 65 - 99 mg/dL   BUN 24 (H) 6 - 20 mg/dL   Creatinine, Ser 1.90 (H) 0.44 - 1.00 mg/dL   Calcium 8.7 (L) 8.9 - 10.3 mg/dL   GFR calc non Af Amer 28 (L) >60 mL/min   GFR calc Af Amer 32 (L) >60  mL/min   Anion gap 11 5 - 15  CBC with Differential  Result Value Ref Range   WBC 4.3 4.0 - 10.5 K/uL   RBC 4.07 3.87 - 5.11 MIL/uL   Hemoglobin 11.5 (L) 12.0 - 15.0 g/dL   HCT 35.0 (L) 36.0 - 46.0 %   MCV 86.0 78.0 - 100.0 fL   MCH 28.3 26.0 - 34.0 pg   MCHC 32.9 30.0 - 36.0 g/dL   RDW 13.8 11.5 - 15.5 %   Platelets 247 150 - 400 K/uL   Neutrophils Relative % 53 %   Neutro Abs 2.3 1.7 - 7.7 K/uL   Lymphocytes Relative 33 %   Lymphs Abs 1.4 0.7 - 4.0 K/uL   Monocytes Relative 11 %   Monocytes Absolute 0.5 0.1 - 1.0 K/uL   Eosinophils Relative 2 %   Eosinophils Absolute 0.1 0.0 - 0.7 K/uL   Basophils Relative 1 %   Basophils Absolute 0.0 0.0 - 0.1 K/uL  Troponin I  Result Value Ref Range   Troponin I <0.03 <0.031 ng/mL  Brain natriuretic peptide  Result Value Ref Range   B Natriuretic Peptide 45.0 0.0 - 100.0 pg/mL   Dg Chest 2 View 01/23/2016  CLINICAL DATA:  Cough, difficulty breathing. EXAM: CHEST  2 VIEW COMPARISON:  September 10, 2015. FINDINGS: The heart size and mediastinal contours are within normal limits. Both lungs are clear. No pneumothorax or pleural effusion is noted. The  visualized skeletal structures are unremarkable. IMPRESSION: No active cardiopulmonary disease. Electronically Signed   By: Marijo Conception, M.D.   On: 01/23/2016 09:19    Results for Souder, RAMATA GENAO (MRN [id number]) as of 01/23/2016 11:47  Ref. Range 01/16/2014 06:17 01/17/2014 05:42 01/18/2014 07:59 02/20/2014 08:14 04/04/2014 15:57 09/10/2015 21:33 01/23/2016 08:30  BUN Latest Ref Range: 6-20 mg/dL 43 (H) 58 (H) 53 (H) 24 (H)  24 (H) 24 (H)  Creatinine Latest Ref Range: 0.44-1.00 mg/dL 1.77 (H) 1.97 (H) 1.73 (H) 1.34 (H) 1.70 (H) 1.69 (H) 1.90 (H)    1145:  Pt states she "feels better" after neb and steroid.  NAD, lungs CTA bilat, no wheezing, resps easy, speaking full sentences, Sats 92-95% R/A.  Pt ambulated around the ED with Sats remaining 91 % R/A, resps easy, NAD.  Pt states she wants to go home now (does not want to be admitted). States she has enough albuterol MDI and neb soln at home and does not need rx. Tx symptomatically for COPD exacerbation. Dx and testing d/w pt and family.  Questions answered.  Verb understanding, agreeable to d/c home with outpt f/u.    Francine Graven, DO 01/27/16 1629

## 2016-01-23 NOTE — Discharge Instructions (Signed)
Take the prescription as directed.  Use your albuterol inhaler (2 to 4 puffs) or your albuterol nebulizer (1 unit dose) every 4 hours for the next 7 days, then as needed for cough, wheezing, or shortness of breath.  Call your Pulmonologist today to schedule a follow up appointment within the next  2 to 3 days.  Return to the Emergency Department immediately sooner if worsening.

## 2016-01-23 NOTE — ED Notes (Signed)
Dr. Thurnell Garbe at bedside updating patient and family.

## 2016-01-23 NOTE — ED Notes (Addendum)
Pt c/o shortness of breath since Monday, reports hx of COPD. Using neb tx and inhaler without relief per patient. Also reports non productive cough. Pt went to PCP Wednesday and was started on antibiotics.

## 2016-02-09 DIAGNOSIS — J449 Chronic obstructive pulmonary disease, unspecified: Secondary | ICD-10-CM | POA: Diagnosis not present

## 2016-02-09 DIAGNOSIS — Z6838 Body mass index (BMI) 38.0-38.9, adult: Secondary | ICD-10-CM | POA: Diagnosis not present

## 2016-02-09 DIAGNOSIS — E1165 Type 2 diabetes mellitus with hyperglycemia: Secondary | ICD-10-CM | POA: Diagnosis not present

## 2016-02-23 DIAGNOSIS — E1122 Type 2 diabetes mellitus with diabetic chronic kidney disease: Secondary | ICD-10-CM | POA: Diagnosis not present

## 2016-02-23 DIAGNOSIS — R600 Localized edema: Secondary | ICD-10-CM | POA: Diagnosis not present

## 2016-02-23 DIAGNOSIS — N189 Chronic kidney disease, unspecified: Secondary | ICD-10-CM | POA: Diagnosis not present

## 2016-02-23 DIAGNOSIS — N184 Chronic kidney disease, stage 4 (severe): Secondary | ICD-10-CM | POA: Diagnosis not present

## 2016-02-23 DIAGNOSIS — E1165 Type 2 diabetes mellitus with hyperglycemia: Secondary | ICD-10-CM | POA: Diagnosis not present

## 2016-02-25 DIAGNOSIS — E1165 Type 2 diabetes mellitus with hyperglycemia: Secondary | ICD-10-CM | POA: Diagnosis not present

## 2016-03-10 DIAGNOSIS — E1122 Type 2 diabetes mellitus with diabetic chronic kidney disease: Secondary | ICD-10-CM | POA: Diagnosis not present

## 2016-03-10 DIAGNOSIS — M184 Other bilateral secondary osteoarthritis of first carpometacarpal joints: Secondary | ICD-10-CM | POA: Diagnosis not present

## 2016-03-10 DIAGNOSIS — N184 Chronic kidney disease, stage 4 (severe): Secondary | ICD-10-CM | POA: Diagnosis not present

## 2016-03-10 DIAGNOSIS — I10 Essential (primary) hypertension: Secondary | ICD-10-CM | POA: Diagnosis not present

## 2016-03-17 DIAGNOSIS — J4550 Severe persistent asthma, uncomplicated: Secondary | ICD-10-CM | POA: Diagnosis not present

## 2016-04-07 DIAGNOSIS — E1122 Type 2 diabetes mellitus with diabetic chronic kidney disease: Secondary | ICD-10-CM | POA: Diagnosis not present

## 2016-04-07 DIAGNOSIS — I10 Essential (primary) hypertension: Secondary | ICD-10-CM | POA: Diagnosis not present

## 2016-04-07 DIAGNOSIS — N184 Chronic kidney disease, stage 4 (severe): Secondary | ICD-10-CM | POA: Diagnosis not present

## 2016-04-08 DIAGNOSIS — I10 Essential (primary) hypertension: Secondary | ICD-10-CM | POA: Diagnosis not present

## 2016-04-08 DIAGNOSIS — N184 Chronic kidney disease, stage 4 (severe): Secondary | ICD-10-CM | POA: Diagnosis not present

## 2016-04-08 DIAGNOSIS — E119 Type 2 diabetes mellitus without complications: Secondary | ICD-10-CM | POA: Diagnosis not present

## 2016-04-08 DIAGNOSIS — J449 Chronic obstructive pulmonary disease, unspecified: Secondary | ICD-10-CM | POA: Diagnosis not present

## 2016-04-19 DIAGNOSIS — J4550 Severe persistent asthma, uncomplicated: Secondary | ICD-10-CM | POA: Diagnosis not present

## 2016-04-27 DIAGNOSIS — I10 Essential (primary) hypertension: Secondary | ICD-10-CM | POA: Diagnosis not present

## 2016-04-27 DIAGNOSIS — I509 Heart failure, unspecified: Secondary | ICD-10-CM | POA: Diagnosis not present

## 2016-04-27 DIAGNOSIS — E1165 Type 2 diabetes mellitus with hyperglycemia: Secondary | ICD-10-CM | POA: Diagnosis not present

## 2016-04-27 DIAGNOSIS — J449 Chronic obstructive pulmonary disease, unspecified: Secondary | ICD-10-CM | POA: Diagnosis not present

## 2016-05-10 DIAGNOSIS — E1165 Type 2 diabetes mellitus with hyperglycemia: Secondary | ICD-10-CM | POA: Diagnosis not present

## 2016-05-24 DIAGNOSIS — E1165 Type 2 diabetes mellitus with hyperglycemia: Secondary | ICD-10-CM | POA: Diagnosis not present

## 2016-06-12 DIAGNOSIS — J4550 Severe persistent asthma, uncomplicated: Secondary | ICD-10-CM | POA: Diagnosis not present

## 2016-06-23 DIAGNOSIS — E1122 Type 2 diabetes mellitus with diabetic chronic kidney disease: Secondary | ICD-10-CM | POA: Diagnosis not present

## 2016-06-23 DIAGNOSIS — R21 Rash and other nonspecific skin eruption: Secondary | ICD-10-CM | POA: Diagnosis not present

## 2016-06-23 DIAGNOSIS — N183 Chronic kidney disease, stage 3 (moderate): Secondary | ICD-10-CM | POA: Diagnosis not present

## 2016-06-23 DIAGNOSIS — J449 Chronic obstructive pulmonary disease, unspecified: Secondary | ICD-10-CM | POA: Diagnosis not present

## 2016-07-15 DIAGNOSIS — N184 Chronic kidney disease, stage 4 (severe): Secondary | ICD-10-CM | POA: Diagnosis not present

## 2016-07-15 DIAGNOSIS — I129 Hypertensive chronic kidney disease with stage 1 through stage 4 chronic kidney disease, or unspecified chronic kidney disease: Secondary | ICD-10-CM | POA: Diagnosis not present

## 2016-07-15 DIAGNOSIS — I5032 Chronic diastolic (congestive) heart failure: Secondary | ICD-10-CM | POA: Diagnosis not present

## 2016-07-15 DIAGNOSIS — E119 Type 2 diabetes mellitus without complications: Secondary | ICD-10-CM | POA: Diagnosis not present

## 2016-08-24 DIAGNOSIS — N183 Chronic kidney disease, stage 3 (moderate): Secondary | ICD-10-CM | POA: Diagnosis not present

## 2016-08-24 DIAGNOSIS — E1122 Type 2 diabetes mellitus with diabetic chronic kidney disease: Secondary | ICD-10-CM | POA: Diagnosis not present

## 2016-08-24 DIAGNOSIS — Z6841 Body Mass Index (BMI) 40.0 and over, adult: Secondary | ICD-10-CM | POA: Diagnosis not present

## 2016-08-24 DIAGNOSIS — Z23 Encounter for immunization: Secondary | ICD-10-CM | POA: Diagnosis not present

## 2016-08-27 DIAGNOSIS — J441 Chronic obstructive pulmonary disease with (acute) exacerbation: Secondary | ICD-10-CM | POA: Diagnosis not present

## 2016-08-27 DIAGNOSIS — E669 Obesity, unspecified: Secondary | ICD-10-CM | POA: Diagnosis not present

## 2016-08-27 DIAGNOSIS — I10 Essential (primary) hypertension: Secondary | ICD-10-CM | POA: Diagnosis not present

## 2016-08-27 DIAGNOSIS — I509 Heart failure, unspecified: Secondary | ICD-10-CM | POA: Diagnosis not present

## 2016-09-02 ENCOUNTER — Other Ambulatory Visit (HOSPITAL_BASED_OUTPATIENT_CLINIC_OR_DEPARTMENT_OTHER): Payer: Self-pay

## 2016-09-02 DIAGNOSIS — G4730 Sleep apnea, unspecified: Secondary | ICD-10-CM

## 2016-09-09 ENCOUNTER — Ambulatory Visit: Payer: BLUE CROSS/BLUE SHIELD | Attending: Pulmonary Disease | Admitting: Neurology

## 2016-09-09 DIAGNOSIS — G4733 Obstructive sleep apnea (adult) (pediatric): Secondary | ICD-10-CM | POA: Diagnosis not present

## 2016-09-09 DIAGNOSIS — G4730 Sleep apnea, unspecified: Secondary | ICD-10-CM | POA: Diagnosis not present

## 2016-09-10 DIAGNOSIS — J4550 Severe persistent asthma, uncomplicated: Secondary | ICD-10-CM | POA: Diagnosis not present

## 2016-09-11 NOTE — Procedures (Signed)
  [place] A. Merlene Laughter, MD     [email or web address]             NOCTURNAL POLYSOMNOGRAPHY   LOCATION: [facility]   Gender: Female D.O.B: 03/31/1956 Age (years): 60 Referring Provider: Lennox Solders Height (inches): 74 Interpreting Physician: Phillips Odor MD, ABSM Weight (lbs): 202 RPSGT: Peak, Robert BMI: 41 MRN: [id number] Neck Size: 16.00 CLINICAL INFORMATION Sleep Study Type: Split Night CPAP Indication for sleep study: N/A Epworth Sleepiness Score: 17 SLEEP STUDY TECHNIQUE As per the AASM Manual for the Scoring of Sleep and Associated Events v2.3 (April 2016) with a hypopnea requiring 4% desaturations. The channels recorded and monitored were frontal, central and occipital EEG, electrooculogram (EOG), submentalis EMG (chin), nasal and oral airflow, thoracic and abdominal wall motion, anterior tibialis EMG, snore microphone, electrocardiogram, and pulse oximetry. Continuous positive airway pressure (CPAP) was initiated when the patient met split night criteria and was titrated according to treat sleep-disordered breathing. MEDICATIONS Medications self-administered by patient taken the night of the study : N/A Scheduled Meds: Continuous Infusions: PRN Meds:.  RESPIRATORY PARAMETERS Most of the events were central hypoapnias unresponsive to CPAP.   Diagnostic Total AHI (/hr): 29.8 RDI (/hr): 32.7 OA Index (/hr): 0.4 CA Index (/hr): 2.5 REM AHI (/hr): 68.6 NREM AHI (/hr): 25.6 Supine AHI (/hr): 31.9 Non-supine AHI (/hr): 28.99 Min O2 Sat (%): 80.00 Mean O2 (%): 92.73 Time below 88% (min): 4.0     Titration Optimal Pressure (cm):   AHI at Optimal Pressure (/hr): N/A Min O2 at Optimal Pressure (%): 85.00 Supine % at Optimal (%): N/A Sleep % at Optimal (%): N/A     SLEEP ARCHITECTURE The recording time for the entire night was 441.8 minutes. During a baseline period of 229.5 minutes, the patient slept for 143.0 minutes in REM and nonREM, yielding a  sleep efficiency of 62.3%. Sleep onset after lights out was 21.8 minutes with a REM latency of 130.5 minutes. The patient spent 9.44% of the night in stage N1 sleep, 64.69% in stage N2 sleep, 16.08% in stage N3 and 9.79% in REM. During the titration period of 206.9 minutes, the patient slept for 169.0 minutes in REM and nonREM, yielding a sleep efficiency of 81.7%. Sleep onset after CPAP initiation was 14.4 minutes with a REM latency of 97.5 minutes. The patient spent 5.62% of the night in stage N1 sleep, 55.92% in stage N2 sleep, 15.97% in stage N3 and 22.48% in REM. CARDIAC DATA The 2 lead EKG demonstrated sinus rhythm. The mean heart rate was 63.89 beats per minute. Other EKG findings include: None. LEG MOVEMENT DATA The total Periodic Limb Movements of Sleep (PLMS) were 0. The PLMS index was 0.00.   IMPRESSIONS - Moderate complex sleep apnea occurred during the diagnostic portion of the study(AHI = 29.8/hour). Conventional CPAP was unsuccessful. A formal sleep consultation is suggested for further evaluation and treatment.    Delano Metz, MD Diplomate, American Board of Sleep Medicine.

## 2016-10-06 DIAGNOSIS — I5042 Chronic combined systolic (congestive) and diastolic (congestive) heart failure: Secondary | ICD-10-CM | POA: Diagnosis not present

## 2016-10-06 DIAGNOSIS — G4737 Central sleep apnea in conditions classified elsewhere: Secondary | ICD-10-CM | POA: Diagnosis not present

## 2016-10-06 DIAGNOSIS — E039 Hypothyroidism, unspecified: Secondary | ICD-10-CM | POA: Diagnosis not present

## 2016-10-06 DIAGNOSIS — G4733 Obstructive sleep apnea (adult) (pediatric): Secondary | ICD-10-CM | POA: Diagnosis not present

## 2016-10-19 DIAGNOSIS — E119 Type 2 diabetes mellitus without complications: Secondary | ICD-10-CM | POA: Diagnosis not present

## 2016-10-19 DIAGNOSIS — N184 Chronic kidney disease, stage 4 (severe): Secondary | ICD-10-CM | POA: Diagnosis not present

## 2016-10-19 DIAGNOSIS — J449 Chronic obstructive pulmonary disease, unspecified: Secondary | ICD-10-CM | POA: Diagnosis not present

## 2016-10-19 DIAGNOSIS — I10 Essential (primary) hypertension: Secondary | ICD-10-CM | POA: Diagnosis not present

## 2016-11-09 ENCOUNTER — Other Ambulatory Visit (HOSPITAL_BASED_OUTPATIENT_CLINIC_OR_DEPARTMENT_OTHER): Payer: Self-pay

## 2016-11-09 DIAGNOSIS — G4731 Primary central sleep apnea: Secondary | ICD-10-CM

## 2016-11-14 ENCOUNTER — Ambulatory Visit: Payer: BLUE CROSS/BLUE SHIELD | Attending: Neurology | Admitting: Neurology

## 2016-11-14 DIAGNOSIS — G4731 Primary central sleep apnea: Secondary | ICD-10-CM | POA: Diagnosis not present

## 2016-11-14 DIAGNOSIS — G4761 Periodic limb movement disorder: Secondary | ICD-10-CM | POA: Insufficient documentation

## 2016-11-14 DIAGNOSIS — Z7984 Long term (current) use of oral hypoglycemic drugs: Secondary | ICD-10-CM | POA: Diagnosis not present

## 2016-11-14 DIAGNOSIS — Z7982 Long term (current) use of aspirin: Secondary | ICD-10-CM | POA: Diagnosis not present

## 2016-11-14 DIAGNOSIS — Z7951 Long term (current) use of inhaled steroids: Secondary | ICD-10-CM | POA: Diagnosis not present

## 2016-11-14 DIAGNOSIS — Z79899 Other long term (current) drug therapy: Secondary | ICD-10-CM | POA: Insufficient documentation

## 2016-11-14 DIAGNOSIS — G4733 Obstructive sleep apnea (adult) (pediatric): Secondary | ICD-10-CM | POA: Diagnosis not present

## 2016-11-23 DIAGNOSIS — E039 Hypothyroidism, unspecified: Secondary | ICD-10-CM | POA: Diagnosis not present

## 2016-11-23 DIAGNOSIS — G4731 Primary central sleep apnea: Secondary | ICD-10-CM | POA: Diagnosis not present

## 2016-11-23 DIAGNOSIS — E1122 Type 2 diabetes mellitus with diabetic chronic kidney disease: Secondary | ICD-10-CM | POA: Diagnosis not present

## 2016-11-23 DIAGNOSIS — N183 Chronic kidney disease, stage 3 (moderate): Secondary | ICD-10-CM | POA: Diagnosis not present

## 2016-11-28 NOTE — Procedures (Signed)
[place] A. Merlene Laughter, MD     [email or web address]             NOCTURNAL POLYSOMNOGRAPHY   LOCATION: [facility]  Patient Name: Rebekah Ray, Rebekah Ray Date: 11/14/2016 Gender: Female D.O.B: Nov 17, 1955 Age (years): 2 Referring Provider: Phillips Odor MD, ABSM Height (inches): 59 Interpreting Physician: Phillips Odor MD, ABSM Weight (lbs): 202 RPSGT: Rosebud Poles BMI: 41 MRN: [id number] Neck Size: 16.00 CLINICAL INFORMATION The patient is referred for a adaptive servo-ventilator titration study. Most recent polysomnogram dated 09/09/2016 revealed an AHI of 29.8/h and RDI of 32.7/h. Most recent titration study dated 09/09/2016 revealed an AHI of 39.0/h. MEDICATIONS Medications self-administered by patient taken the night of the study : N/A  Current Outpatient Prescriptions:  .  acetaminophen (TYLENOL) 500 MG tablet, Take 500 mg by mouth every 6 (six) hours as needed for mild pain., Disp: , Rfl:  .  albuterol (PROVENTIL HFA;VENTOLIN HFA) 108 (90 BASE) MCG/ACT inhaler, Inhale 2 puffs into the lungs every 6 (six) hours as needed for wheezing or shortness of breath., Disp: 1 Inhaler, Rfl: 5 .  albuterol (PROVENTIL) (2.5 MG/3ML) 0.083% nebulizer solution, Take 2.5 mg by nebulization every 6 (six) hours as needed for wheezing or shortness of breath., Disp: , Rfl:  .  amLODipine (NORVASC) 5 MG tablet, Take 5 mg by mouth daily., Disp: , Rfl:  .  aspirin EC 81 MG tablet, Take 81 mg by mouth daily., Disp: , Rfl:  .  atorvastatin (LIPITOR) 20 MG tablet, Take 20 mg by mouth at bedtime., Disp: , Rfl:  .  azithromycin (ZITHROMAX) 250 MG tablet, Take 250 mg by mouth daily., Disp: , Rfl:  .  carvedilol (COREG) 12.5 MG tablet, Take 12.5 mg by mouth 2 (two) times daily with a meal., Disp: , Rfl:  .  ferrous sulfate 325 (65 FE) MG tablet, Take 325 mg by mouth 2 (two) times a week., Disp: , Rfl:  .  Fluticasone Furoate-Vilanterol (BREO ELLIPTA) 100-25 MCG/INH AEPB, Inhale 1 puff into  the lungs daily., Disp: , Rfl:  .  furosemide (LASIX) 20 MG tablet, Take 1 tablet (20 mg total) by mouth daily as needed for fluid., Disp: 30 tablet, Rfl: 0 .  hydrALAZINE (APRESOLINE) 50 MG tablet, Take 1 tablet (50 mg total) by mouth every 8 (eight) hours., Disp: 90 tablet, Rfl: 0 .  levothyroxine (SYNTHROID, LEVOTHROID) 50 MCG tablet, Take 50 mcg by mouth daily., Disp: , Rfl:  .  predniSONE (DELTASONE) 20 MG tablet, Take 2 tablets (40 mg total) by mouth daily., Disp: 10 tablet, Rfl: 0 .  sitaGLIPtin (JANUVIA) 100 MG tablet, Take 50 mg by mouth daily., Disp: , Rfl:  .  Tiotropium Bromide Monohydrate (SPIRIVA RESPIMAT) 2.5 MCG/ACT AERS, Inhale 2.5 mcg into the lungs daily., Disp: , Rfl:    SLEEP STUDY TECHNIQUE As per the AASM Manual for the Scoring of Sleep and Associated Events v2.3 (April 2016) with a hypopnea requiring 4% desaturations.  The channels recorded and monitored were frontal, central and occipital EEG, electrooculogram (EOG), submentalis EMG (chin), nasal and oral airflow, thoracic and abdominal wall motion, anterior tibialis EMG, snore microphone, electrocardiogram, and pulse oximetry.  RESPIRATORY PARAMETERS Optimal Min IPAP (cm): 5 Optimal Max IPAP (cm): 8 Optimal Min EPAP (cm): 5 Optimal Max EPAP (cm): 8 Optimal Max Pressure (cm): 15 Optimal Min PS (cm): 3 Optimal Max PS (cm): 15 Opitmal Breathing Rate (/min): Auto Overall Min O2 (%): 87.00 Min O2 at Optimal Pressure (%): 0.00 AHI at Optimal (/hr): N/A  SLEEP ARCHITECTURE During a recording time of 359.6 minutes, the patient slept for 281.5 minutes. Sleep efficiency was 78.3%. The patient spent 2.84% of the night in stage N1 sleep, 55.77% in stage N2 sleep, 31.62% in stage N3 and 9.77% in REM. Wake after sleep onset (WASO) was 59.9 minutes. Alpha intrusion was PRESENT ABSENT. Supine sleep was 58.97%. The arousal index was 10.9.  LEG MOVEMENT DATA PLM Index (/hr):  PLM Arousal Index (/hr): 5.1 CARDIAC DATA The 2 lead EKG  demonstrated sinus rhythm. The mean heart rate was N/A beats per minute. Other EKG findings include: None.   IMPRESSIONS - Trial of SV Advance EPAP Min 5 and Max 8 cmH2O, Pressure Support Min 3 and Max 15 cmH2O with Max Pressure of 14 cmH2O and Breath Rate of Auto BrPM.  - Severe periodic limb movements of sleep occurred during the study.    RECOMMENDATIONS - Trial of SV Advance EPAP Min 5 and Max 8 cmH2O, Pressure Support Min 3 and Max 15 cmH2O with Max Pressure of 14 cmH2O and Breath Rate of Auto BrPM.    Delano Metz, MD Diplomate, American Board of Sleep Medicine.

## 2016-12-07 DIAGNOSIS — I5042 Chronic combined systolic (congestive) and diastolic (congestive) heart failure: Secondary | ICD-10-CM | POA: Diagnosis not present

## 2016-12-07 DIAGNOSIS — G4733 Obstructive sleep apnea (adult) (pediatric): Secondary | ICD-10-CM | POA: Diagnosis not present

## 2016-12-07 DIAGNOSIS — G4737 Central sleep apnea in conditions classified elsewhere: Secondary | ICD-10-CM | POA: Diagnosis not present

## 2016-12-07 DIAGNOSIS — R5383 Other fatigue: Secondary | ICD-10-CM | POA: Diagnosis not present

## 2016-12-28 DIAGNOSIS — E1121 Type 2 diabetes mellitus with diabetic nephropathy: Secondary | ICD-10-CM | POA: Diagnosis not present

## 2016-12-28 DIAGNOSIS — J449 Chronic obstructive pulmonary disease, unspecified: Secondary | ICD-10-CM | POA: Diagnosis not present

## 2016-12-28 DIAGNOSIS — I10 Essential (primary) hypertension: Secondary | ICD-10-CM | POA: Diagnosis not present

## 2017-01-20 DIAGNOSIS — I130 Hypertensive heart and chronic kidney disease with heart failure and stage 1 through stage 4 chronic kidney disease, or unspecified chronic kidney disease: Secondary | ICD-10-CM | POA: Diagnosis not present

## 2017-01-20 DIAGNOSIS — I5032 Chronic diastolic (congestive) heart failure: Secondary | ICD-10-CM | POA: Diagnosis not present

## 2017-01-20 DIAGNOSIS — E1122 Type 2 diabetes mellitus with diabetic chronic kidney disease: Secondary | ICD-10-CM | POA: Diagnosis not present

## 2017-01-20 DIAGNOSIS — N184 Chronic kidney disease, stage 4 (severe): Secondary | ICD-10-CM | POA: Diagnosis not present

## 2017-02-21 DIAGNOSIS — E1122 Type 2 diabetes mellitus with diabetic chronic kidney disease: Secondary | ICD-10-CM | POA: Diagnosis not present

## 2017-02-21 DIAGNOSIS — E039 Hypothyroidism, unspecified: Secondary | ICD-10-CM | POA: Diagnosis not present

## 2017-02-21 DIAGNOSIS — J449 Chronic obstructive pulmonary disease, unspecified: Secondary | ICD-10-CM | POA: Diagnosis not present

## 2017-02-21 DIAGNOSIS — N183 Chronic kidney disease, stage 3 (moderate): Secondary | ICD-10-CM | POA: Diagnosis not present

## 2017-02-21 DIAGNOSIS — E669 Obesity, unspecified: Secondary | ICD-10-CM | POA: Diagnosis not present

## 2017-05-05 ENCOUNTER — Ambulatory Visit (HOSPITAL_COMMUNITY)
Admission: RE | Admit: 2017-05-05 | Discharge: 2017-05-05 | Disposition: A | Discharge status: Home / Self Care | Payer: BLUE CROSS/BLUE SHIELD | Source: Ambulatory Visit | Attending: Pulmonary Disease | Admitting: Pulmonary Disease

## 2017-05-05 ENCOUNTER — Other Ambulatory Visit (HOSPITAL_COMMUNITY): Payer: Self-pay | Admitting: Pulmonary Disease

## 2017-05-05 DIAGNOSIS — R071 Chest pain on breathing: Secondary | ICD-10-CM | POA: Diagnosis not present

## 2017-05-05 DIAGNOSIS — R079 Chest pain, unspecified: Secondary | ICD-10-CM | POA: Diagnosis not present

## 2017-05-05 DIAGNOSIS — R05 Cough: Secondary | ICD-10-CM | POA: Diagnosis not present

## 2017-05-23 DIAGNOSIS — N183 Chronic kidney disease, stage 3 (moderate): Secondary | ICD-10-CM | POA: Diagnosis not present

## 2017-05-23 DIAGNOSIS — E1122 Type 2 diabetes mellitus with diabetic chronic kidney disease: Secondary | ICD-10-CM | POA: Diagnosis not present

## 2017-05-23 DIAGNOSIS — J449 Chronic obstructive pulmonary disease, unspecified: Secondary | ICD-10-CM | POA: Diagnosis not present

## 2017-05-23 DIAGNOSIS — E039 Hypothyroidism, unspecified: Secondary | ICD-10-CM | POA: Diagnosis not present

## 2017-06-04 DIAGNOSIS — H524 Presbyopia: Secondary | ICD-10-CM | POA: Diagnosis not present

## 2017-06-16 DIAGNOSIS — I10 Essential (primary) hypertension: Secondary | ICD-10-CM | POA: Diagnosis not present

## 2017-06-16 DIAGNOSIS — J449 Chronic obstructive pulmonary disease, unspecified: Secondary | ICD-10-CM | POA: Diagnosis not present

## 2017-06-16 DIAGNOSIS — E119 Type 2 diabetes mellitus without complications: Secondary | ICD-10-CM | POA: Diagnosis not present

## 2017-06-16 DIAGNOSIS — N184 Chronic kidney disease, stage 4 (severe): Secondary | ICD-10-CM | POA: Diagnosis not present

## 2017-06-24 ENCOUNTER — Other Ambulatory Visit (HOSPITAL_COMMUNITY): Payer: Self-pay | Admitting: Family Medicine

## 2017-06-24 DIAGNOSIS — Z1231 Encounter for screening mammogram for malignant neoplasm of breast: Secondary | ICD-10-CM

## 2017-06-27 DIAGNOSIS — J449 Chronic obstructive pulmonary disease, unspecified: Secondary | ICD-10-CM | POA: Diagnosis not present

## 2017-06-27 DIAGNOSIS — E668 Other obesity: Secondary | ICD-10-CM | POA: Diagnosis not present

## 2017-06-27 DIAGNOSIS — G2581 Restless legs syndrome: Secondary | ICD-10-CM | POA: Diagnosis not present

## 2017-06-27 DIAGNOSIS — I10 Essential (primary) hypertension: Secondary | ICD-10-CM | POA: Diagnosis not present

## 2017-06-30 ENCOUNTER — Ambulatory Visit (HOSPITAL_COMMUNITY)
Admission: RE | Admit: 2017-06-30 | Discharge: 2017-06-30 | Disposition: A | Discharge status: Home / Self Care | Payer: BLUE CROSS/BLUE SHIELD | Source: Ambulatory Visit | Attending: Family Medicine | Admitting: Family Medicine

## 2017-06-30 ENCOUNTER — Other Ambulatory Visit (HOSPITAL_COMMUNITY): Payer: Self-pay | Admitting: Family Medicine

## 2017-06-30 ENCOUNTER — Encounter (HOSPITAL_COMMUNITY): Payer: Self-pay

## 2017-06-30 DIAGNOSIS — Z1231 Encounter for screening mammogram for malignant neoplasm of breast: Secondary | ICD-10-CM

## 2017-08-29 DIAGNOSIS — E1122 Type 2 diabetes mellitus with diabetic chronic kidney disease: Secondary | ICD-10-CM | POA: Diagnosis not present

## 2017-08-29 DIAGNOSIS — E039 Hypothyroidism, unspecified: Secondary | ICD-10-CM | POA: Diagnosis not present

## 2017-08-29 DIAGNOSIS — Z23 Encounter for immunization: Secondary | ICD-10-CM | POA: Diagnosis not present

## 2017-08-29 DIAGNOSIS — J449 Chronic obstructive pulmonary disease, unspecified: Secondary | ICD-10-CM | POA: Diagnosis not present

## 2017-08-29 DIAGNOSIS — N183 Chronic kidney disease, stage 3 (moderate): Secondary | ICD-10-CM | POA: Diagnosis not present

## 2017-11-16 DIAGNOSIS — I10 Essential (primary) hypertension: Secondary | ICD-10-CM | POA: Diagnosis not present

## 2017-11-16 DIAGNOSIS — M1000 Idiopathic gout, unspecified site: Secondary | ICD-10-CM | POA: Diagnosis not present

## 2017-11-16 DIAGNOSIS — N183 Chronic kidney disease, stage 3 (moderate): Secondary | ICD-10-CM | POA: Diagnosis not present

## 2017-11-16 DIAGNOSIS — E1122 Type 2 diabetes mellitus with diabetic chronic kidney disease: Secondary | ICD-10-CM | POA: Diagnosis not present

## 2017-12-28 DIAGNOSIS — J449 Chronic obstructive pulmonary disease, unspecified: Secondary | ICD-10-CM | POA: Diagnosis not present

## 2017-12-28 DIAGNOSIS — I10 Essential (primary) hypertension: Secondary | ICD-10-CM | POA: Diagnosis not present

## 2017-12-28 DIAGNOSIS — I509 Heart failure, unspecified: Secondary | ICD-10-CM | POA: Diagnosis not present

## 2017-12-28 DIAGNOSIS — N183 Chronic kidney disease, stage 3 (moderate): Secondary | ICD-10-CM | POA: Diagnosis not present

## 2018-01-28 DIAGNOSIS — J4550 Severe persistent asthma, uncomplicated: Secondary | ICD-10-CM | POA: Diagnosis not present

## 2018-01-28 DIAGNOSIS — J449 Chronic obstructive pulmonary disease, unspecified: Secondary | ICD-10-CM | POA: Diagnosis not present

## 2018-02-04 ENCOUNTER — Emergency Department (HOSPITAL_COMMUNITY)
Admission: EM | Admit: 2018-02-04 | Discharge: 2018-02-04 | Disposition: A | Discharge status: Home / Self Care | Payer: BLUE CROSS/BLUE SHIELD | Attending: Emergency Medicine | Admitting: Emergency Medicine

## 2018-02-04 ENCOUNTER — Other Ambulatory Visit: Payer: Self-pay

## 2018-02-04 ENCOUNTER — Emergency Department (HOSPITAL_COMMUNITY): Payer: BLUE CROSS/BLUE SHIELD

## 2018-02-04 ENCOUNTER — Encounter (HOSPITAL_COMMUNITY): Payer: Self-pay | Admitting: Emergency Medicine

## 2018-02-04 DIAGNOSIS — I509 Heart failure, unspecified: Secondary | ICD-10-CM | POA: Diagnosis not present

## 2018-02-04 DIAGNOSIS — Z87891 Personal history of nicotine dependence: Secondary | ICD-10-CM | POA: Insufficient documentation

## 2018-02-04 DIAGNOSIS — S63632A Sprain of interphalangeal joint of right middle finger, initial encounter: Secondary | ICD-10-CM | POA: Diagnosis not present

## 2018-02-04 DIAGNOSIS — I110 Hypertensive heart disease with heart failure: Secondary | ICD-10-CM | POA: Diagnosis not present

## 2018-02-04 DIAGNOSIS — Y92810 Car as the place of occurrence of the external cause: Secondary | ICD-10-CM | POA: Diagnosis not present

## 2018-02-04 DIAGNOSIS — E119 Type 2 diabetes mellitus without complications: Secondary | ICD-10-CM | POA: Diagnosis not present

## 2018-02-04 DIAGNOSIS — Z79899 Other long term (current) drug therapy: Secondary | ICD-10-CM | POA: Diagnosis not present

## 2018-02-04 DIAGNOSIS — Z7982 Long term (current) use of aspirin: Secondary | ICD-10-CM | POA: Diagnosis not present

## 2018-02-04 DIAGNOSIS — J449 Chronic obstructive pulmonary disease, unspecified: Secondary | ICD-10-CM | POA: Diagnosis not present

## 2018-02-04 DIAGNOSIS — Y999 Unspecified external cause status: Secondary | ICD-10-CM | POA: Diagnosis not present

## 2018-02-04 DIAGNOSIS — X58XXXA Exposure to other specified factors, initial encounter: Secondary | ICD-10-CM | POA: Diagnosis not present

## 2018-02-04 DIAGNOSIS — Y9389 Activity, other specified: Secondary | ICD-10-CM | POA: Diagnosis not present

## 2018-02-04 DIAGNOSIS — S6991XA Unspecified injury of right wrist, hand and finger(s), initial encounter: Secondary | ICD-10-CM | POA: Diagnosis not present

## 2018-02-04 DIAGNOSIS — M79644 Pain in right finger(s): Secondary | ICD-10-CM | POA: Diagnosis not present

## 2018-02-04 MED ORDER — TRAMADOL HCL 50 MG PO TABS: 50.0000 mg | ORAL_TABLET | Freq: Four times a day (QID) | ORAL | 0 refills | Status: DC | PRN

## 2018-02-04 NOTE — Discharge Instructions (Addendum)
Apply ice packs on/off to your finger.  Keep the finger splinted for at least one week.  Call Dr. Ruthe Ray office to arrange a follow-up appt in one week if not improving

## 2018-02-04 NOTE — ED Triage Notes (Signed)
Patient states she was cleaning her car and bent her right middle finger.

## 2018-02-04 NOTE — ED Provider Notes (Signed)
[facility] EMERGENCY DEPARTMENT Provider Note   CSN: [id number] Arrival date & time: 02/04/18  1515     History   Chief Complaint Chief Complaint  Patient presents with  . Finger Injury    HPI Rebekah Ray is a 62 y.o. female.  HPI   Rebekah Ray is a 63 y.o. female who presents to the Emergency Department complaining of pain and swelling to her right middle finger.  She states that she was cleaning the seat of her car when she injured her finger.  She is unsure how the injury occurred.  She notes pain with movement of the distal and of the finger.  Gradual onset of swelling at the joint.  She denies wrist pain or other injuries.  No numbness.  She is applied ice with some relief.   Past Medical History:  Diagnosis Date  . COPD (chronic obstructive pulmonary disease) ([place])   . Diabetes mellitus without complication ([place])   . Hypertension   . Renal disorder   . Thyroid disease     Patient Active Problem List   Diagnosis Date Noted  . COPD (chronic obstructive pulmonary disease) ([place]) 01/18/2014  . Acute kidney injury ([place]) 01/16/2014  . HTN (hypertension) 01/15/2014  . Noncompliance 01/15/2014  . Tobacco abuse 01/15/2014  . CHF (congestive heart failure) ([place]) 01/14/2014  . Acute bronchitis 01/14/2014  . DM (diabetes mellitus) ([place]) 01/14/2014  . Hypertensive urgency 01/14/2014  . Low back pain 08/06/2011    Past Surgical History:  Procedure Laterality Date  . BREAST BIOPSY    . CESAREAN SECTION    . FOOT SURGERY    . TUBAL LIGATION       OB History   None      Home Medications    Prior to Admission medications   Medication Sig Start Date End Date Taking? Authorizing Provider  acetaminophen (TYLENOL) 500 MG tablet Take 500 mg by mouth every 6 (six) hours as needed for mild pain.    [provider]  albuterol (PROVENTIL HFA;VENTOLIN HFA) 108 (90 BASE) MCG/ACT inhaler Inhale 2 puffs into the lungs every 6 (six) hours as needed for wheezing  or shortness of breath. 01/18/14   Dhungel, Flonnie Overman, MD  albuterol (PROVENTIL) (2.5 MG/3ML) 0.083% nebulizer solution Take 2.5 mg by nebulization every 6 (six) hours as needed for wheezing or shortness of breath.    [provider]  amLODipine (NORVASC) 5 MG tablet Take 5 mg by mouth daily.    [provider]  aspirin EC 81 MG tablet Take 81 mg by mouth daily.    [provider]  atorvastatin (LIPITOR) 20 MG tablet Take 20 mg by mouth at bedtime.    [provider]  azithromycin (ZITHROMAX) 250 MG tablet Take 250 mg by mouth daily.    [provider]  carvedilol (COREG) 12.5 MG tablet Take 12.5 mg by mouth 2 (two) times daily with a meal.    [provider]  ferrous sulfate 325 (65 FE) MG tablet Take 325 mg by mouth 2 (two) times a week.    [provider]  Fluticasone Furoate-Vilanterol (BREO ELLIPTA) 100-25 MCG/INH AEPB Inhale 1 puff into the lungs daily.    [provider]  furosemide (LASIX) 20 MG tablet Take 1 tablet (20 mg total) by mouth daily as needed for fluid. 09/10/15   Tanna Furry, MD  hydrALAZINE (APRESOLINE) 50 MG tablet Take 1 tablet (50 mg total) by mouth every 8 (eight) hours. 01/18/14  Dhungel, Nishant, MD  levothyroxine (SYNTHROID, LEVOTHROID) 50 MCG tablet Take 50 mcg by mouth daily. 02/13/14   [provider]  predniSONE (DELTASONE) 20 MG tablet Take 2 tablets (40 mg total) by mouth daily. 01/23/16   Francine Graven, DO  sitaGLIPtin (JANUVIA) 100 MG tablet Take 50 mg by mouth daily.    [provider]  Tiotropium Bromide Monohydrate (SPIRIVA RESPIMAT) 2.5 MCG/ACT AERS Inhale 2.5 mcg into the lungs daily.    [provider]    Family History History reviewed. No pertinent family history.  Social History Social History   Tobacco Use  . Smoking status: Former Smoker    Types: Cigarettes    Last attempt to quit: 01/22/2014    Years since quitting: 4.0  . Smokeless tobacco:  Former Network engineer Use Topics  . Alcohol use: No  . Drug use: No     Allergies   Patient has no known allergies.   Review of Systems Review of Systems  Constitutional: Negative for chills and fever.  Musculoskeletal: Positive for arthralgias (right middle finger pain and swelling) and joint swelling.  Skin: Negative for color change and wound.  Neurological: Negative for weakness and numbness.     Physical Exam Updated Vital Signs BP (!) 158/77 (BP Location: Right Arm)   Pulse 71   Temp 98.3 F (36.8 C) (Oral)   Resp 16   Ht 4\' 11"  (1.499 m)   Wt 93 kg (205 lb)   SpO2 97%   BMI 41.40 kg/m   Physical Exam  Constitutional: She is oriented to person, place, and time. She appears well-developed and well-nourished. No distress.  HENT:  Head: Atraumatic.  Mouth/Throat: Oropharynx is clear and moist.  Neck: Normal range of motion.  Cardiovascular: Normal rate, regular rhythm and intact distal pulses.  Pulmonary/Chest: Effort normal. No respiratory distress.  Musculoskeletal: She exhibits edema and tenderness. She exhibits no deformity.  ttp and mild edema of the DIP joint of the right middle finger.  No open wounds.  Nail is nml appearing.   Neurological: She is alert and oriented to person, place, and time. No sensory deficit.  Skin: Skin is warm. Capillary refill takes less than 2 seconds. No erythema.  Nursing note and vitals reviewed.    ED Treatments / Results  Labs (all labs ordered are listed, but only abnormal results are displayed) Labs Reviewed - No data to display  EKG None  Radiology Dg Finger Middle Right  Result Date: 02/04/2018 CLINICAL DATA:  Right middle finger pain after injury. EXAM: RIGHT MIDDLE FINGER 2+V COMPARISON:  None. FINDINGS: There is no evidence of fracture or dislocation. There is no evidence of arthropathy or other focal bone abnormality. Soft tissues are unremarkable. IMPRESSION: No acute abnormality seen in the right middle  finger. Electronically Signed   By: Marijo Conception, M.D.   On: 02/04/2018 17:18    Procedures Procedures (including critical care time)  Medications Ordered in ED Medications - No data to display   Initial Impression / Assessment and Plan / ED Course  I have reviewed the triage vital signs and the nursing notes.  Pertinent labs & imaging results that were available during my care of the patient were reviewed by me and considered in my medical decision making (see chart for details).    Neurovascularly intact.  X-ray reassuring. Possible mallet finger.  Finger splinted.  Discussed importance of close orthopedic f/u in one week.  Pt verbalized understanding.    Final Clinical  Impressions(s) / ED Diagnoses   Final diagnoses:  Sprain of interphalangeal joint of right middle finger, initial encounter    ED Discharge Orders    None       Bufford Lope 02/05/18 2019    Dorie Rank, MD 02/13/18 [phone]

## 2018-02-14 DIAGNOSIS — N183 Chronic kidney disease, stage 3 (moderate): Secondary | ICD-10-CM | POA: Diagnosis not present

## 2018-02-14 DIAGNOSIS — E1122 Type 2 diabetes mellitus with diabetic chronic kidney disease: Secondary | ICD-10-CM | POA: Diagnosis not present

## 2018-02-14 DIAGNOSIS — J449 Chronic obstructive pulmonary disease, unspecified: Secondary | ICD-10-CM | POA: Diagnosis not present

## 2018-02-14 DIAGNOSIS — E039 Hypothyroidism, unspecified: Secondary | ICD-10-CM | POA: Diagnosis not present

## 2018-03-17 DIAGNOSIS — E119 Type 2 diabetes mellitus without complications: Secondary | ICD-10-CM | POA: Diagnosis not present

## 2018-03-17 DIAGNOSIS — I10 Essential (primary) hypertension: Secondary | ICD-10-CM | POA: Diagnosis not present

## 2018-03-17 DIAGNOSIS — R32 Unspecified urinary incontinence: Secondary | ICD-10-CM | POA: Diagnosis not present

## 2018-03-17 DIAGNOSIS — N184 Chronic kidney disease, stage 4 (severe): Secondary | ICD-10-CM | POA: Diagnosis not present

## 2018-03-28 DIAGNOSIS — J4550 Severe persistent asthma, uncomplicated: Secondary | ICD-10-CM | POA: Diagnosis not present

## 2018-03-28 DIAGNOSIS — J449 Chronic obstructive pulmonary disease, unspecified: Secondary | ICD-10-CM | POA: Diagnosis not present

## 2018-05-26 ENCOUNTER — Ambulatory Visit (INDEPENDENT_AMBULATORY_CARE_PROVIDER_SITE_OTHER): Payer: BLUE CROSS/BLUE SHIELD | Admitting: Urology

## 2018-05-26 ENCOUNTER — Other Ambulatory Visit (HOSPITAL_COMMUNITY)
Admission: RE | Admit: 2018-05-26 | Discharge: 2018-05-26 | Disposition: A | Discharge status: Home / Self Care | Payer: BLUE CROSS/BLUE SHIELD | Source: Other Acute Inpatient Hospital | Attending: Urology | Admitting: Urology

## 2018-05-26 DIAGNOSIS — N952 Postmenopausal atrophic vaginitis: Secondary | ICD-10-CM

## 2018-05-26 DIAGNOSIS — R32 Unspecified urinary incontinence: Secondary | ICD-10-CM | POA: Diagnosis not present

## 2018-05-26 DIAGNOSIS — N3946 Mixed incontinence: Secondary | ICD-10-CM

## 2018-05-26 LAB — URINALYSIS, COMPLETE (UACMP) WITH MICROSCOPIC
BACTERIA UA: NONE SEEN
BILIRUBIN URINE: NEGATIVE
Glucose, UA: 150 mg/dL — AB
HGB URINE DIPSTICK: NEGATIVE
KETONES UR: NEGATIVE mg/dL
NITRITE: NEGATIVE
PH: 5 (ref 5.0–8.0)
Protein, ur: NEGATIVE mg/dL
Specific Gravity, Urine: 1.01 (ref 1.005–1.030)

## 2018-05-27 LAB — URINE CULTURE

## 2018-05-29 ENCOUNTER — Other Ambulatory Visit (HOSPITAL_COMMUNITY): Payer: Self-pay | Admitting: Family Medicine

## 2018-05-29 DIAGNOSIS — Z1231 Encounter for screening mammogram for malignant neoplasm of breast: Secondary | ICD-10-CM

## 2018-06-10 DIAGNOSIS — H524 Presbyopia: Secondary | ICD-10-CM | POA: Diagnosis not present

## 2018-06-10 DIAGNOSIS — H52223 Regular astigmatism, bilateral: Secondary | ICD-10-CM | POA: Diagnosis not present

## 2018-06-10 DIAGNOSIS — H5212 Myopia, left eye: Secondary | ICD-10-CM | POA: Diagnosis not present

## 2018-06-12 DIAGNOSIS — N3946 Mixed incontinence: Secondary | ICD-10-CM | POA: Diagnosis not present

## 2018-06-12 DIAGNOSIS — R350 Frequency of micturition: Secondary | ICD-10-CM | POA: Diagnosis not present

## 2018-06-20 DIAGNOSIS — E118 Type 2 diabetes mellitus with unspecified complications: Secondary | ICD-10-CM | POA: Diagnosis not present

## 2018-06-20 DIAGNOSIS — J449 Chronic obstructive pulmonary disease, unspecified: Secondary | ICD-10-CM | POA: Diagnosis not present

## 2018-06-20 DIAGNOSIS — N183 Chronic kidney disease, stage 3 (moderate): Secondary | ICD-10-CM | POA: Diagnosis not present

## 2018-06-20 DIAGNOSIS — I10 Essential (primary) hypertension: Secondary | ICD-10-CM | POA: Diagnosis not present

## 2018-06-20 DIAGNOSIS — N3941 Urge incontinence: Secondary | ICD-10-CM | POA: Diagnosis not present

## 2018-06-26 DIAGNOSIS — E1121 Type 2 diabetes mellitus with diabetic nephropathy: Secondary | ICD-10-CM | POA: Diagnosis not present

## 2018-06-26 DIAGNOSIS — I509 Heart failure, unspecified: Secondary | ICD-10-CM | POA: Diagnosis not present

## 2018-06-26 DIAGNOSIS — I10 Essential (primary) hypertension: Secondary | ICD-10-CM | POA: Diagnosis not present

## 2018-06-26 DIAGNOSIS — J449 Chronic obstructive pulmonary disease, unspecified: Secondary | ICD-10-CM | POA: Diagnosis not present

## 2018-07-04 DIAGNOSIS — M6289 Other specified disorders of muscle: Secondary | ICD-10-CM | POA: Diagnosis not present

## 2018-07-04 DIAGNOSIS — R350 Frequency of micturition: Secondary | ICD-10-CM | POA: Diagnosis not present

## 2018-07-04 DIAGNOSIS — N3946 Mixed incontinence: Secondary | ICD-10-CM | POA: Diagnosis not present

## 2018-07-04 DIAGNOSIS — M6281 Muscle weakness (generalized): Secondary | ICD-10-CM | POA: Diagnosis not present

## 2018-07-06 ENCOUNTER — Encounter (HOSPITAL_COMMUNITY): Payer: Self-pay

## 2018-07-06 ENCOUNTER — Ambulatory Visit (HOSPITAL_COMMUNITY)
Admission: RE | Admit: 2018-07-06 | Discharge: 2018-07-06 | Disposition: A | Discharge status: Home / Self Care | Payer: BLUE CROSS/BLUE SHIELD | Source: Ambulatory Visit | Attending: Family Medicine | Admitting: Family Medicine

## 2018-07-06 DIAGNOSIS — Z1231 Encounter for screening mammogram for malignant neoplasm of breast: Secondary | ICD-10-CM | POA: Insufficient documentation

## 2018-07-14 ENCOUNTER — Ambulatory Visit (INDEPENDENT_AMBULATORY_CARE_PROVIDER_SITE_OTHER): Payer: BLUE CROSS/BLUE SHIELD | Admitting: Urology

## 2018-07-14 DIAGNOSIS — N3946 Mixed incontinence: Secondary | ICD-10-CM | POA: Diagnosis not present

## 2018-07-14 DIAGNOSIS — R350 Frequency of micturition: Secondary | ICD-10-CM | POA: Diagnosis not present

## 2018-07-14 DIAGNOSIS — N1370 Vesicoureteral-reflux, unspecified: Secondary | ICD-10-CM

## 2018-07-17 DIAGNOSIS — M62838 Other muscle spasm: Secondary | ICD-10-CM | POA: Diagnosis not present

## 2018-07-17 DIAGNOSIS — M6281 Muscle weakness (generalized): Secondary | ICD-10-CM | POA: Diagnosis not present

## 2018-07-17 DIAGNOSIS — N3946 Mixed incontinence: Secondary | ICD-10-CM | POA: Diagnosis not present

## 2018-07-17 DIAGNOSIS — M6289 Other specified disorders of muscle: Secondary | ICD-10-CM | POA: Diagnosis not present

## 2018-07-24 DIAGNOSIS — M6281 Muscle weakness (generalized): Secondary | ICD-10-CM | POA: Diagnosis not present

## 2018-07-24 DIAGNOSIS — M6289 Other specified disorders of muscle: Secondary | ICD-10-CM | POA: Diagnosis not present

## 2018-07-24 DIAGNOSIS — N3946 Mixed incontinence: Secondary | ICD-10-CM | POA: Diagnosis not present

## 2018-07-24 DIAGNOSIS — M62838 Other muscle spasm: Secondary | ICD-10-CM | POA: Diagnosis not present

## 2018-08-07 DIAGNOSIS — M62838 Other muscle spasm: Secondary | ICD-10-CM | POA: Diagnosis not present

## 2018-08-07 DIAGNOSIS — M6289 Other specified disorders of muscle: Secondary | ICD-10-CM | POA: Diagnosis not present

## 2018-08-07 DIAGNOSIS — N3946 Mixed incontinence: Secondary | ICD-10-CM | POA: Diagnosis not present

## 2018-08-07 DIAGNOSIS — M6281 Muscle weakness (generalized): Secondary | ICD-10-CM | POA: Diagnosis not present

## 2018-09-25 DIAGNOSIS — R32 Unspecified urinary incontinence: Secondary | ICD-10-CM | POA: Diagnosis not present

## 2018-09-25 DIAGNOSIS — I10 Essential (primary) hypertension: Secondary | ICD-10-CM | POA: Diagnosis not present

## 2018-09-25 DIAGNOSIS — N184 Chronic kidney disease, stage 4 (severe): Secondary | ICD-10-CM | POA: Diagnosis not present

## 2018-09-25 DIAGNOSIS — E119 Type 2 diabetes mellitus without complications: Secondary | ICD-10-CM | POA: Diagnosis not present

## 2018-09-28 DIAGNOSIS — I5032 Chronic diastolic (congestive) heart failure: Secondary | ICD-10-CM | POA: Diagnosis not present

## 2018-09-28 DIAGNOSIS — I110 Hypertensive heart disease with heart failure: Secondary | ICD-10-CM | POA: Diagnosis not present

## 2018-09-28 DIAGNOSIS — I358 Other nonrheumatic aortic valve disorders: Secondary | ICD-10-CM | POA: Diagnosis not present

## 2018-10-03 DIAGNOSIS — N184 Chronic kidney disease, stage 4 (severe): Secondary | ICD-10-CM | POA: Diagnosis not present

## 2018-10-17 DIAGNOSIS — I358 Other nonrheumatic aortic valve disorders: Secondary | ICD-10-CM | POA: Diagnosis not present

## 2018-10-17 DIAGNOSIS — I5032 Chronic diastolic (congestive) heart failure: Secondary | ICD-10-CM | POA: Diagnosis not present

## 2018-10-23 DIAGNOSIS — E119 Type 2 diabetes mellitus without complications: Secondary | ICD-10-CM | POA: Diagnosis not present

## 2018-10-23 DIAGNOSIS — J449 Chronic obstructive pulmonary disease, unspecified: Secondary | ICD-10-CM | POA: Diagnosis not present

## 2018-10-23 DIAGNOSIS — I10 Essential (primary) hypertension: Secondary | ICD-10-CM | POA: Diagnosis not present

## 2018-10-23 DIAGNOSIS — E1122 Type 2 diabetes mellitus with diabetic chronic kidney disease: Secondary | ICD-10-CM | POA: Diagnosis not present

## 2018-10-23 DIAGNOSIS — R799 Abnormal finding of blood chemistry, unspecified: Secondary | ICD-10-CM | POA: Diagnosis not present

## 2018-10-23 DIAGNOSIS — Z794 Long term (current) use of insulin: Secondary | ICD-10-CM | POA: Diagnosis not present

## 2018-10-23 DIAGNOSIS — E039 Hypothyroidism, unspecified: Secondary | ICD-10-CM | POA: Diagnosis not present

## 2018-10-23 DIAGNOSIS — R7309 Other abnormal glucose: Secondary | ICD-10-CM | POA: Diagnosis not present

## 2018-10-23 DIAGNOSIS — N183 Chronic kidney disease, stage 3 (moderate): Secondary | ICD-10-CM | POA: Diagnosis not present

## 2019-02-12 DIAGNOSIS — E039 Hypothyroidism, unspecified: Secondary | ICD-10-CM | POA: Diagnosis not present

## 2019-02-12 DIAGNOSIS — I10 Essential (primary) hypertension: Secondary | ICD-10-CM | POA: Diagnosis not present

## 2019-02-12 DIAGNOSIS — E1122 Type 2 diabetes mellitus with diabetic chronic kidney disease: Secondary | ICD-10-CM | POA: Diagnosis not present

## 2019-02-12 DIAGNOSIS — E1165 Type 2 diabetes mellitus with hyperglycemia: Secondary | ICD-10-CM | POA: Diagnosis not present

## 2019-02-26 DIAGNOSIS — S00522D Blister (nonthermal) of oral cavity, subsequent encounter: Secondary | ICD-10-CM | POA: Diagnosis not present

## 2019-02-26 DIAGNOSIS — I10 Essential (primary) hypertension: Secondary | ICD-10-CM | POA: Diagnosis not present

## 2019-04-23 DIAGNOSIS — N184 Chronic kidney disease, stage 4 (severe): Secondary | ICD-10-CM | POA: Diagnosis not present

## 2019-04-23 DIAGNOSIS — E119 Type 2 diabetes mellitus without complications: Secondary | ICD-10-CM | POA: Diagnosis not present

## 2019-04-23 DIAGNOSIS — R32 Unspecified urinary incontinence: Secondary | ICD-10-CM | POA: Diagnosis not present

## 2019-04-23 DIAGNOSIS — I10 Essential (primary) hypertension: Secondary | ICD-10-CM | POA: Diagnosis not present

## 2019-06-11 ENCOUNTER — Other Ambulatory Visit (HOSPITAL_COMMUNITY): Payer: Self-pay | Admitting: Family Medicine

## 2019-06-11 DIAGNOSIS — Z1231 Encounter for screening mammogram for malignant neoplasm of breast: Secondary | ICD-10-CM

## 2019-06-17 DIAGNOSIS — H5212 Myopia, left eye: Secondary | ICD-10-CM | POA: Diagnosis not present

## 2019-06-17 DIAGNOSIS — H52223 Regular astigmatism, bilateral: Secondary | ICD-10-CM | POA: Diagnosis not present

## 2019-06-17 DIAGNOSIS — H524 Presbyopia: Secondary | ICD-10-CM | POA: Diagnosis not present

## 2019-07-03 DIAGNOSIS — I5022 Chronic systolic (congestive) heart failure: Secondary | ICD-10-CM | POA: Diagnosis not present

## 2019-07-03 DIAGNOSIS — E039 Hypothyroidism, unspecified: Secondary | ICD-10-CM | POA: Diagnosis not present

## 2019-07-03 DIAGNOSIS — I10 Essential (primary) hypertension: Secondary | ICD-10-CM | POA: Diagnosis not present

## 2019-07-03 DIAGNOSIS — E1122 Type 2 diabetes mellitus with diabetic chronic kidney disease: Secondary | ICD-10-CM | POA: Diagnosis not present

## 2019-07-03 DIAGNOSIS — N183 Chronic kidney disease, stage 3 (moderate): Secondary | ICD-10-CM | POA: Diagnosis not present

## 2019-07-12 ENCOUNTER — Other Ambulatory Visit: Payer: Self-pay

## 2019-07-12 ENCOUNTER — Ambulatory Visit (HOSPITAL_COMMUNITY)
Admission: RE | Admit: 2019-07-12 | Discharge: 2019-07-12 | Disposition: A | Discharge status: Home / Self Care | Payer: BC Managed Care – PPO | Source: Ambulatory Visit | Attending: Family Medicine | Admitting: Family Medicine

## 2019-07-12 DIAGNOSIS — Z1231 Encounter for screening mammogram for malignant neoplasm of breast: Secondary | ICD-10-CM

## 2019-07-24 DIAGNOSIS — Z23 Encounter for immunization: Secondary | ICD-10-CM | POA: Diagnosis not present

## 2019-08-08 DIAGNOSIS — I129 Hypertensive chronic kidney disease with stage 1 through stage 4 chronic kidney disease, or unspecified chronic kidney disease: Secondary | ICD-10-CM | POA: Diagnosis not present

## 2019-08-08 DIAGNOSIS — E669 Obesity, unspecified: Secondary | ICD-10-CM | POA: Diagnosis not present

## 2019-08-08 DIAGNOSIS — E1121 Type 2 diabetes mellitus with diabetic nephropathy: Secondary | ICD-10-CM | POA: Diagnosis not present

## 2019-08-08 DIAGNOSIS — J449 Chronic obstructive pulmonary disease, unspecified: Secondary | ICD-10-CM | POA: Diagnosis not present

## 2019-10-09 DIAGNOSIS — I10 Essential (primary) hypertension: Secondary | ICD-10-CM | POA: Diagnosis not present

## 2019-10-09 DIAGNOSIS — E039 Hypothyroidism, unspecified: Secondary | ICD-10-CM | POA: Diagnosis not present

## 2019-10-09 DIAGNOSIS — E1122 Type 2 diabetes mellitus with diabetic chronic kidney disease: Secondary | ICD-10-CM | POA: Diagnosis not present

## 2019-10-09 DIAGNOSIS — Z7189 Other specified counseling: Secondary | ICD-10-CM | POA: Diagnosis not present

## 2019-10-11 DIAGNOSIS — I130 Hypertensive heart and chronic kidney disease with heart failure and stage 1 through stage 4 chronic kidney disease, or unspecified chronic kidney disease: Secondary | ICD-10-CM | POA: Diagnosis not present

## 2019-10-11 DIAGNOSIS — I5032 Chronic diastolic (congestive) heart failure: Secondary | ICD-10-CM | POA: Diagnosis not present

## 2019-10-11 DIAGNOSIS — N184 Chronic kidney disease, stage 4 (severe): Secondary | ICD-10-CM | POA: Diagnosis not present

## 2019-10-11 DIAGNOSIS — I359 Nonrheumatic aortic valve disorder, unspecified: Secondary | ICD-10-CM | POA: Diagnosis not present

## 2019-10-23 DIAGNOSIS — R32 Unspecified urinary incontinence: Secondary | ICD-10-CM | POA: Diagnosis not present

## 2019-10-23 DIAGNOSIS — N184 Chronic kidney disease, stage 4 (severe): Secondary | ICD-10-CM | POA: Diagnosis not present

## 2019-10-23 DIAGNOSIS — E119 Type 2 diabetes mellitus without complications: Secondary | ICD-10-CM | POA: Diagnosis not present

## 2019-10-23 DIAGNOSIS — I10 Essential (primary) hypertension: Secondary | ICD-10-CM | POA: Diagnosis not present

## 2020-03-14 ENCOUNTER — Encounter: Payer: Self-pay | Admitting: *Deleted

## 2020-03-14 ENCOUNTER — Ambulatory Visit (HOSPITAL_COMMUNITY)
Admission: RE | Admit: 2020-03-14 | Discharge: 2020-03-14 | Disposition: A | Discharge status: Home / Self Care | Payer: Medicare Other | Source: Ambulatory Visit | Attending: Internal Medicine | Admitting: Internal Medicine

## 2020-03-14 ENCOUNTER — Telehealth: Payer: Self-pay | Admitting: Internal Medicine

## 2020-03-14 ENCOUNTER — Ambulatory Visit: Payer: BC Managed Care – PPO | Admitting: Internal Medicine

## 2020-03-14 ENCOUNTER — Encounter: Payer: Self-pay | Admitting: Internal Medicine

## 2020-03-14 ENCOUNTER — Other Ambulatory Visit: Payer: Self-pay

## 2020-03-14 DIAGNOSIS — J449 Chronic obstructive pulmonary disease, unspecified: Secondary | ICD-10-CM | POA: Insufficient documentation

## 2020-03-14 DIAGNOSIS — R0602 Shortness of breath: Secondary | ICD-10-CM | POA: Diagnosis not present

## 2020-03-14 NOTE — Progress Notes (Signed)
Rebekah Ray, female    DOB: 25-Nov-1955,     MRN: [id number]   Brief patient profile:  65 yowf from [place] moved to [place] age [age] quit smoking 01/2014 with pt says dx of copd by Luan Pulling self-referred to pulmonary clinic in [place] 03/14/2020 with minimal evidence for airflow obst on prior spirometry dated 09/16/14 which is c/w asthma s significant copd    History of Present Illness  03/14/2020  Pulmonary/ 1st office eval/Kendrick Remigio / off inhalers x > month s change in symptoms Chief Complaint  Patient presents with  . Consult    former Dr. Luan Pulling pt.  COPD.  sob with exertion.  dry cough, sneezing.  Dyspnea:   Walks 20 min with problems with hills stops x twice  -no ex tol    on vs off breo vs spiriva Cough: better since quit smoker  Sleep: bed is flat/ one pillow - no noct cough/ wheeze or need for saba  SABA use: ventolin avg twice daily only when over does it, when rides mower cuts grass needs more often    day to day or daytime variability  Reported with extremes of temp but no assoc excess/ purulent sputum or mucus plugs or hemoptysis or cp or chest tightness, subjective wheeze or overt sinus or hb symptoms.   Sleeping as above without nocturnal  or early am exacerbation  of respiratory  c/o's or need for noct saba. Also denies any obvious fluctuation of symptoms with weather or environmental changes or other aggravating or alleviating factors except as outlined above   No unusual exposure hx or h/o childhood pna/ asthma or knowledge of premature birth.  Current Allergies, Complete Past Medical History, Past Surgical History, Family History, and Social History were reviewed in [employer] record.  ROS  The following are not active complaints unless bolded Hoarseness, sore throat, dysphagia, dental problems, itching, sneezing,  nasal congestion or discharge of excess mucus or purulent secretions, ear ache,   fever, chills, sweats, unintended wt loss or wt gain,  classically pleuritic or exertional cp,  orthopnea pnd or arm/hand swelling  or leg swelling, presyncope, palpitations, abdominal pain, anorexia, nausea, vomiting, diarrhea  or change in bowel habits or change in bladder habits, change in stools or change in urine, dysuria, hematuria,  rash, arthralgias, visual complaints, headache, numbness, weakness or ataxia or problems with walking or coordination,  change in mood or  memory.           Past Medical History:  Diagnosis Date  . COPD (chronic obstructive pulmonary disease) ([place])   . Diabetes mellitus without complication ([place])   . Hypertension   . Renal disorder   . Thyroid disease     Outpatient Medications Prior to Visit  Medication Sig Dispense Refill  . acetaminophen (TYLENOL) 500 MG tablet Take 500 mg by mouth every 6 (six) hours as needed for mild pain.    Marland Kitchen albuterol (PROVENTIL HFA;VENTOLIN HFA) 108 (90 BASE) MCG/ACT inhaler Inhale 2 puffs into the lungs every 6 (six) hours as needed for wheezing or shortness of breath. 1 Inhaler 5  . albuterol (PROVENTIL) (2.5 MG/3ML) 0.083% nebulizer solution Take 2.5 mg by nebulization every 6 (six) hours as needed for wheezing or shortness of breath.    Marland Kitchen aspirin EC 81 MG tablet Take 81 mg by mouth daily.    Marland Kitchen atorvastatin (LIPITOR) 20 MG tablet Take 20 mg by mouth at bedtime.    . carvedilol (COREG) 12.5 MG tablet Take 25  mg by mouth 2 (two) times daily with a meal.     . ferrous sulfate 325 (65 FE) MG tablet Take 325 mg by mouth 2 (two) times a week.    . Fluticasone Furoate-Vilanterol (BREO ELLIPTA) 100-25 MCG/INH AEPB Not using for over a month    . furosemide (LASIX) 20 MG tablet Take 1 tablet (20 mg total) by mouth daily as needed for fluid. 30 tablet 0  . hydrALAZINE (APRESOLINE) 50 MG tablet Take 1 tablet (50 mg total) by mouth every 8 (eight) hours. 90 tablet 0  . insulin glargine (LANTUS) 100 UNIT/ML injection Inject 47 Units into the skin in the morning.    Marland Kitchen levothyroxine (SYNTHROID,  LEVOTHROID) 50 MCG tablet Take 50 mcg by mouth daily.    . sitaGLIPtin (JANUVIA) 100 MG tablet Take 50 mg by mouth daily.    . Tiotropium Bromide Monohydrate (SPIRIVA RESPIMAT) 2.5 MCG/ACT AERS Not using     . amLODipine (NORVASC) 5 MG tablet Take 5 mg by mouth daily.    .       .      0  . traMADol (ULTRAM) 50 MG tablet Take 1 tablet (50 mg total) by mouth every 6 (six) hours as needed. (Patient not taking: Reported on 03/14/2020) 10 tablet 0      Objective:     BP 140/80 (BP Location: Left Arm, Cuff Size: Normal)   Pulse 74   Temp (!) 97 F (36.1 C) (Temporal)   Ht 4' 10.5" (1.486 m)   Wt 196 lb (88.9 kg)   SpO2 97%   BMI 40.27 kg/m   SpO2: 97 % RA   Obese wf nad    HEENT : pt wearing mask not removed for exam due to covid -19 concerns.    NECK :  without JVD/Nodes/TM/ nl carotid upstrokes bilaterally   LUNGS: no acc muscle use,  Nl contour chest which is clear to A and P bilaterally without cough on insp or exp maneuvers   CV:  RRR  no s3 or murmur or increase in P2, and no edema   ABD:  Markedly obese but soft and nontender with limited inspiratory excursion in the supine position. No bruits or organomegaly appreciated, bowel sounds nl  MS:  Nl gait/ ext warm without deformities, calf tenderness, cyanosis or clubbing No obvious joint restrictions   SKIN: warm and dry without lesions    NEURO:  alert, approp, nl sensorium with  no motor or cerebellar deficits apparent.    CXR PA and Lateral:   03/14/2020 :    I personally reviewed images and agree with radiology impression as follows:    No acute cardiopulmonary process.    Assessment   No problem-specific Assessment & Plan notes found for this encounter.     Christinia Gully, MD 03/14/2020

## 2020-03-14 NOTE — Patient Instructions (Addendum)
Weight control is simply a matter of calorie balance which needs to be tilted in your favor by eating less and exercising more.  To get the most out of exercise, you need to be continuously aware that you are short of breath, but never out of breath, for 30 minutes daily but check 02 saturations while walking to be sure they stay over 90%   Try albuterol 15 min alternate days  before an activity that you know would make you short of breath and see if it makes any difference and if makes none then don't take it after activity unless you can't catch your breath.      Please remember to go to the  x-ray department  for your tests - we will call you with the results when they are available    Please schedule a follow up office visit in 6 weeks, call sooner if needed with pfts

## 2020-03-14 NOTE — Telephone Encounter (Signed)
Spoke with the pt and she states needing work excuse for the time that she quarantines for her PFT  I have created letter and it's in her Mychart so she can print  Nothing further needed per pt

## 2020-03-15 ENCOUNTER — Encounter: Payer: Self-pay | Admitting: Internal Medicine

## 2020-03-15 NOTE — Assessment & Plan Note (Addendum)
Quit smoking 09/2019  - Spirometry 09/16/14   FEV1 2.01 (98%)  Ratio 0.91 p 18% resp to saba with rounded f/v loop not typical at all for copd  But note smoked 5 more years after this study so will classify as at least GOLD 0 for now pending f/u pfts.  Reports no better / no worse on lama/laba/ics and not having aecopd since quit smoking so ok to leave off maint rx for now.  - The proper method of use, as well as anticipated side effects, of a metered-dose inhaler are discussed and demonstrated to the patient   I spent extra time with pt today reviewing appropriate use of albuterol for prn use on exertion with the following points: 1) saba is for relief of sob that does not improve by walking a slower pace or resting but rather if the pt does not improve after trying this first. 2) If the pt is convinced, as many are, that saba helps recover from activity faster then it's easy to tell if this is the case by re-challenging : ie stop, take the inhaler, then p 5 minutes try the exact same activity (intensity of workload) that just caused the symptoms and see if they are substantially diminished or not after saba 3) if there is an activity that reproducibly causes the symptoms, try the saba 15 min before the activity on alternate days   If in fact the saba really does help, then fine to continue to use it prn but advised may need to look closer at the maintenance regimen being used to achieve better control of airways disease with exertion ie place back on maint rx like symbicort 160 since can't tell any difference on vs off breo.  >>> needs return for pfts

## 2020-03-15 NOTE — Assessment & Plan Note (Signed)
Body mass index is 40.27 kg/m.  -  trending   Lab Results  Component Value Date   TSH 3.150 02/20/2014     Contributing to gerd risk/ doe/reviewed the need and the process to achieve and maintain neg calorie balance > defer f/u primary care including intermittently monitoring thyroid status           Each maintenance medication was reviewed in detail including emphasizing most importantly the difference between maintenance and prns and under what circumstances the prns are to be triggered using an action plan format where appropriate.  Total time for H and P, chart review, counseling, teaching device and generating customized AVS unique to this office visit / charting = 60 min

## 2020-03-18 NOTE — Progress Notes (Signed)
Called and left detailed msg on machine ok per DPR.

## 2020-04-08 DIAGNOSIS — E1122 Type 2 diabetes mellitus with diabetic chronic kidney disease: Secondary | ICD-10-CM | POA: Diagnosis not present

## 2020-04-08 DIAGNOSIS — L80 Vitiligo: Secondary | ICD-10-CM | POA: Diagnosis not present

## 2020-04-08 DIAGNOSIS — N183 Chronic kidney disease, stage 3 unspecified: Secondary | ICD-10-CM | POA: Diagnosis not present

## 2020-04-08 DIAGNOSIS — E039 Hypothyroidism, unspecified: Secondary | ICD-10-CM | POA: Diagnosis not present

## 2020-04-08 DIAGNOSIS — I10 Essential (primary) hypertension: Secondary | ICD-10-CM | POA: Diagnosis not present

## 2020-04-08 DIAGNOSIS — M1830 Unilateral post-traumatic osteoarthritis of first carpometacarpal joint, unspecified hand: Secondary | ICD-10-CM | POA: Diagnosis not present

## 2020-04-18 ENCOUNTER — Other Ambulatory Visit: Payer: Self-pay

## 2020-04-18 ENCOUNTER — Other Ambulatory Visit (HOSPITAL_COMMUNITY)
Admission: RE | Admit: 2020-04-18 | Discharge: 2020-04-18 | Disposition: A | Discharge status: Home / Self Care | Payer: Medicare Other | Source: Ambulatory Visit | Attending: Internal Medicine | Admitting: Internal Medicine

## 2020-04-18 DIAGNOSIS — Z20822 Contact with and (suspected) exposure to covid-19: Secondary | ICD-10-CM | POA: Insufficient documentation

## 2020-04-18 DIAGNOSIS — Z01812 Encounter for preprocedural laboratory examination: Secondary | ICD-10-CM | POA: Insufficient documentation

## 2020-04-19 LAB — SARS CORONAVIRUS 2 (TAT 6-24 HRS): SARS Coronavirus 2: NEGATIVE

## 2020-04-22 ENCOUNTER — Ambulatory Visit (HOSPITAL_COMMUNITY)
Admission: RE | Admit: 2020-04-22 | Discharge: 2020-04-22 | Disposition: A | Discharge status: Home / Self Care | Payer: Medicare Other | Source: Ambulatory Visit | Attending: Internal Medicine | Admitting: Internal Medicine

## 2020-04-22 ENCOUNTER — Other Ambulatory Visit: Payer: Self-pay

## 2020-04-22 DIAGNOSIS — J449 Chronic obstructive pulmonary disease, unspecified: Secondary | ICD-10-CM | POA: Insufficient documentation

## 2020-04-22 LAB — PULMONARY FUNCTION TEST
DL/VA % pred: 83 %
DL/VA: 3.67 ml/min/mmHg/L
DLCO unc % pred: 60 %
DLCO unc: 9.62 ml/min/mmHg
FEF 25-75 Post: 2.33 L/sec
FEF 25-75 Pre: 2.02 L/sec
FEF2575-%Change-Post: 15 %
FEF2575-%Pred-Post: 129 %
FEF2575-%Pred-Pre: 112 %
FEV1-%Change-Post: 9 %
FEV1-%Pred-Post: 88 %
FEV1-%Pred-Pre: 80 %
FEV1-Post: 1.65 L
FEV1-Pre: 1.5 L
FEV1FVC-%Change-Post: 4 %
FEV1FVC-%Pred-Pre: 116 %
FEV6-%Change-Post: 5 %
FEV6-%Pred-Post: 75 %
FEV6-%Pred-Pre: 71 %
FEV6-Post: 1.77 L
FEV6-Pre: 1.68 L
FEV6FVC-%Pred-Post: 104 %
FEV6FVC-%Pred-Pre: 104 %
FVC-%Change-Post: 5 %
FVC-%Pred-Post: 72 %
FVC-%Pred-Pre: 68 %
FVC-Post: 1.77 L
FVC-Pre: 1.68 L
Post FEV1/FVC ratio: 93 %
Post FEV6/FVC ratio: 100 %
Pre FEV1/FVC ratio: 90 %
Pre FEV6/FVC Ratio: 100 %
RV % pred: 49 %
RV: 0.88 L
TLC % pred: 65 %
TLC: 2.73 L

## 2020-04-22 MED ORDER — ALBUTEROL SULFATE (2.5 MG/3ML) 0.083% IN NEBU
2.5000 mg | INHALATION_SOLUTION | Freq: Once | RESPIRATORY_TRACT | Status: AC
  Administered 2020-04-22: 2.5 mg via RESPIRATORY_TRACT

## 2020-04-23 DIAGNOSIS — L80 Vitiligo: Secondary | ICD-10-CM | POA: Diagnosis not present

## 2020-04-25 ENCOUNTER — Other Ambulatory Visit: Payer: Self-pay

## 2020-04-25 ENCOUNTER — Ambulatory Visit: Payer: Medicare Other | Admitting: Internal Medicine

## 2020-04-25 ENCOUNTER — Encounter: Payer: Self-pay | Admitting: Internal Medicine

## 2020-04-25 DIAGNOSIS — J449 Chronic obstructive pulmonary disease, unspecified: Secondary | ICD-10-CM

## 2020-04-25 NOTE — Progress Notes (Signed)
Rebekah Ray, female    DOB: [birth year],     MRN: [id number]   Brief patient profile:  35 yowf from [place] moved to [place] age [age] year quit smoking 01/2014 with pt says dx of copd by Luan Pulling self-referred to pulmonary clinic in [place] 03/14/2020 with minimal evidence for airflow obst on prior spirometry dated 09/16/14 which is c/w asthma s significant copd and repeated 04/22/20 no airflow obstruction.     History of Present Illness  03/14/2020  Pulmonary/ 1st office eval/Mackenize Delgadillo / off inhalers x > month s change in symptoms Chief Complaint  Patient presents with  . Consult    former Dr. Luan Pulling pt.  COPD.  sob with exertion.  dry cough, sneezing.  Dyspnea:   Walks 20 min with problems with hills stops x twice  -no ex tol    on vs off breo vs spiriva Cough: better since quit smoker  Sleep: bed is flat/ one pillow - no noct cough/ wheeze or need for saba  SABA use: ventolin avg twice daily only when over does it, when rides mower cuts grass needs more often rec Weight control is simply a matter of calorie balance  Try albuterol 15 min alternate days  before an activity that you know would make you short of breath      Please remember to go to the  x-ray department  for your tests - we will call you with the results when they are available   Please schedule a follow up office visit in 6 weeks, call sooner if needed with pfts    04/25/2020  f/u ov/Kj Imbert re:  Asthma/no copd on pfts 04/22/20  Chief Complaint  Patient presents with  . Follow-up    Breathing is doing well and her cough has resolved. No new co's. She has not been using her albuterol inhaler.   Dyspnea:  MMRC2 = can't walk a nl pace on a flat grade s sob but does fine slow and flat  - does 20 min and improving s saba sats in higher 90s Cough: none Sleeping: flat one pille SABA use: none 02: none    No obvious day to day or daytime variability or assoc excess/ purulent sputum or mucus plugs or hemoptysis or cp or chest tightness,  subjective wheeze or overt sinus or hb symptoms.   Sleep without nocturnal  or early am exacerbation  of respiratory  c/o's or need for noct saba. Also denies any obvious fluctuation of symptoms with weather or environmental changes or other aggravating or alleviating factors except as outlined above   No unusual exposure hx or h/o childhood pna/ asthma or knowledge of premature birth.  Current Allergies, Complete Past Medical History, Past Surgical History, Family History, and Social History were reviewed in [employer] record.  ROS  The following are not active complaints unless bolded Hoarseness, sore throat, dysphagia, dental problems, itching, sneezing,  nasal congestion or discharge of excess mucus or purulent secretions, ear ache,   fever, chills, sweats, unintended wt loss or wt gain, classically pleuritic or exertional cp,  orthopnea pnd or arm/hand swelling  or leg swelling, presyncope, palpitations, abdominal pain, anorexia, nausea, vomiting, diarrhea  or change in bowel habits or change in bladder habits, change in stools or change in urine, dysuria, hematuria,  rash, arthralgias, visual complaints, headache, numbness, weakness or ataxia or problems with walking or coordination,  change in mood or  memory.        Current Meds  Medication Sig  . acetaminophen (TYLENOL) 500 MG tablet Take 500 mg by mouth every 6 (six) hours as needed for mild pain.  Marland Kitchen albuterol (PROVENTIL HFA;VENTOLIN HFA) 108 (90 BASE) MCG/ACT inhaler Inhale 2 puffs into the lungs every 6 (six) hours as needed for wheezing or shortness of breath.  Marland Kitchen albuterol (PROVENTIL) (2.5 MG/3ML) 0.083% nebulizer solution Take 2.5 mg by nebulization every 6 (six) hours as needed for wheezing or shortness of breath.  Marland Kitchen aspirin EC 81 MG tablet Take 81 mg by mouth daily.  Marland Kitchen atorvastatin (LIPITOR) 20 MG tablet Take 20 mg by mouth at bedtime.  . betamethasone dipropionate 0.05 % cream Apply topically 2 (two) times  daily.  . carvedilol (COREG) 12.5 MG tablet Take 25 mg by mouth 2 (two) times daily with a meal.   . fenofibrate (TRICOR) 48 MG tablet Take 1 tablet by mouth daily.  . ferrous sulfate 325 (65 FE) MG tablet Take 325 mg by mouth 2 (two) times a week.  . fluticasone (CUTIVATE) 0.05 % cream Apply topically 2 (two) times daily.  . furosemide (LASIX) 20 MG tablet Take 1 tablet (20 mg total) by mouth daily as needed for fluid.  . hydrALAZINE (APRESOLINE) 50 MG tablet Take 1 tablet (50 mg total) by mouth every 8 (eight) hours.  . insulin glargine (LANTUS) 100 UNIT/ML injection Inject 47 Units into the skin in the morning.  Marland Kitchen levothyroxine (SYNTHROID, LEVOTHROID) 50 MCG tablet Take 50 mcg by mouth daily.  . sitaGLIPtin (JANUVIA) 100 MG tablet Take 50 mg by mouth daily.          Past Medical History:  Diagnosis Date       . Diabetes mellitus without complication ([place])   . Hypertension   . Renal disorder   . Thyroid disease          Objective:    amb wf wf nad    Wt Readings from Last 3 Encounters:  04/25/20 197 lb (89.4 kg)  03/14/20 196 lb (88.9 kg)  02/04/18 205 lb (93 kg)     Vital signs reviewed - Note on arrival 02 sats  97% on RA    Obese pleasant wf nad    HEENT : pt wearing mask not removed for exam due to covid -19 concerns.    NECK :  without JVD/Nodes/TM/ nl carotid upstrokes bilaterally   LUNGS: no acc muscle use,  Nl contour chest which is clear to A and P bilaterally without cough on insp or exp maneuvers   CV:  RRR  no s3 or murmur or increase in P2, and no edema   ABD: obese soft and nontender with nl inspiratory excursion in the supine position. No bruits or organomegaly appreciated, bowel sounds nl  MS:  Nl gait/ ext warm without deformities, calf tenderness, cyanosis or clubbing No obvious joint restrictions   SKIN: warm and dry without lesions    NEURO:  alert, approp, nl sensorium with  no motor or cerebellar deficits apparent.      Assessment

## 2020-04-25 NOTE — Assessment & Plan Note (Signed)
Body mass index is 41.17 kg/m.  -  trending up slightly  Lab Results  Component Value Date   TSH 3.150 02/20/2014     Contributing to gerd risk/ doe/reviewed the need and the process to achieve and maintain neg calorie balance > defer f/u primary care including intermittently monitoring thyroid status            Each maintenance medication was reviewed in detail including emphasizing most importantly the difference between maintenance and prns and under what circumstances the prns are to be triggered using an action plan format where appropriate.  Total time for H and P, chart review, counseling, teaching device and generating customized AVS unique to this office visit / charting = 20 min

## 2020-04-25 NOTE — Assessment & Plan Note (Addendum)
Quit smoking 2015   - Spirometry 09/16/14   FEV1 2.01 (98%)  Ratio 0.91 p 18% resp to saba with rounded f/v loop not typical at all for copd   - PFT's  04/25/2020  FEV1 1.65 (88 % ) ratio 0.93  p 9 % improvement from saba p 0 prior to study with DLCO  9.62 (60%) corrects to 3.67 (83%)  for alv volume and FV curve nl except for ERV nearly 0    Reviewed in detail with pt, she could have mild AB but no evidence at all to support pfts   When respiratory symptoms begin or become refractory well after a patient reports complete smoking cessation,  Especially when this wasn't the case while they were smoking, a red flag is raised based on the work of Dr Kris Mouton which states:  if you quit smoking when your best day FEV1 is still well preserved it is highly unlikely you will progress to severe disease.  That is to say, once the smoking stops,  the symptoms should not suddenly erupt or markedly worsen.  If so, the differential diagnosis should include  obesity/deconditioning,  LPR/Reflux/Aspiration syndromes,  occult CHF, or  especially side effect of medications commonly used in this population.    Rule of 2s reviewed for use of saba > f/u can be prn

## 2020-04-25 NOTE — Patient Instructions (Signed)
You do not have significant copd but still could have a touch of asthma so keep inhaler handy   If you are satisfied with your treatment plan,  let your doctor know and he/she can either refill your medications or you can return here when your prescription runs out.     If in any way you are not 100% satisfied,  please tell us.  If 100% better, tell your friends!  Pulmonary follow up is as needed

## 2020-05-08 DIAGNOSIS — I129 Hypertensive chronic kidney disease with stage 1 through stage 4 chronic kidney disease, or unspecified chronic kidney disease: Secondary | ICD-10-CM | POA: Diagnosis not present

## 2020-05-08 DIAGNOSIS — J449 Chronic obstructive pulmonary disease, unspecified: Secondary | ICD-10-CM | POA: Diagnosis not present

## 2020-05-08 DIAGNOSIS — N184 Chronic kidney disease, stage 4 (severe): Secondary | ICD-10-CM | POA: Diagnosis not present

## 2020-05-08 DIAGNOSIS — E1122 Type 2 diabetes mellitus with diabetic chronic kidney disease: Secondary | ICD-10-CM | POA: Diagnosis not present

## 2020-06-10 ENCOUNTER — Other Ambulatory Visit (HOSPITAL_COMMUNITY): Payer: Self-pay | Admitting: Family Medicine

## 2020-06-10 DIAGNOSIS — Z1231 Encounter for screening mammogram for malignant neoplasm of breast: Secondary | ICD-10-CM

## 2020-07-16 ENCOUNTER — Other Ambulatory Visit: Payer: Self-pay

## 2020-07-16 ENCOUNTER — Ambulatory Visit (HOSPITAL_COMMUNITY)
Admission: RE | Admit: 2020-07-16 | Discharge: 2020-07-16 | Disposition: A | Discharge status: Home / Self Care | Payer: Medicare Other | Source: Ambulatory Visit | Attending: Family Medicine | Admitting: Family Medicine

## 2020-07-16 DIAGNOSIS — Z1231 Encounter for screening mammogram for malignant neoplasm of breast: Secondary | ICD-10-CM | POA: Insufficient documentation

## 2020-07-29 ENCOUNTER — Other Ambulatory Visit (HOSPITAL_COMMUNITY): Payer: Self-pay | Admitting: Family Medicine

## 2020-07-29 DIAGNOSIS — R928 Other abnormal and inconclusive findings on diagnostic imaging of breast: Secondary | ICD-10-CM

## 2020-07-29 DIAGNOSIS — L80 Vitiligo: Secondary | ICD-10-CM | POA: Diagnosis not present

## 2020-08-07 ENCOUNTER — Ambulatory Visit (HOSPITAL_COMMUNITY)
Admission: RE | Admit: 2020-08-07 | Discharge: 2020-08-07 | Disposition: A | Discharge status: Home / Self Care | Payer: Medicare Other | Source: Ambulatory Visit | Attending: Family Medicine | Admitting: Family Medicine

## 2020-08-07 ENCOUNTER — Other Ambulatory Visit: Payer: Self-pay

## 2020-08-07 DIAGNOSIS — R928 Other abnormal and inconclusive findings on diagnostic imaging of breast: Secondary | ICD-10-CM | POA: Insufficient documentation

## 2020-08-11 DIAGNOSIS — Z23 Encounter for immunization: Secondary | ICD-10-CM | POA: Diagnosis not present

## 2020-08-11 DIAGNOSIS — I10 Essential (primary) hypertension: Secondary | ICD-10-CM | POA: Diagnosis not present

## 2020-08-11 DIAGNOSIS — J449 Chronic obstructive pulmonary disease, unspecified: Secondary | ICD-10-CM | POA: Diagnosis not present

## 2020-08-11 DIAGNOSIS — K625 Hemorrhage of anus and rectum: Secondary | ICD-10-CM | POA: Diagnosis not present

## 2020-08-11 DIAGNOSIS — E1122 Type 2 diabetes mellitus with diabetic chronic kidney disease: Secondary | ICD-10-CM | POA: Diagnosis not present

## 2020-08-11 DIAGNOSIS — N183 Chronic kidney disease, stage 3 unspecified: Secondary | ICD-10-CM | POA: Diagnosis not present

## 2020-08-11 DIAGNOSIS — R6250 Unspecified lack of expected normal physiological development in childhood: Secondary | ICD-10-CM | POA: Diagnosis not present

## 2020-08-11 DIAGNOSIS — E039 Hypothyroidism, unspecified: Secondary | ICD-10-CM | POA: Diagnosis not present

## 2020-09-11 ENCOUNTER — Ambulatory Visit
Admission: RE | Admit: 2020-09-11 | Discharge: 2020-09-11 | Disposition: A | Discharge status: Home / Self Care | Payer: Medicare Other | Source: Ambulatory Visit | Attending: Nurse Practitioner | Admitting: Nurse Practitioner

## 2020-09-11 ENCOUNTER — Other Ambulatory Visit: Payer: Self-pay | Admitting: Nurse Practitioner

## 2020-09-11 ENCOUNTER — Other Ambulatory Visit: Payer: Self-pay

## 2020-09-11 ENCOUNTER — Encounter: Payer: Self-pay | Admitting: Internal Medicine

## 2020-09-11 DIAGNOSIS — I110 Hypertensive heart disease with heart failure: Secondary | ICD-10-CM | POA: Diagnosis not present

## 2020-09-11 DIAGNOSIS — E1101 Type 2 diabetes mellitus with hyperosmolarity with coma: Secondary | ICD-10-CM | POA: Diagnosis not present

## 2020-09-11 DIAGNOSIS — M5459 Other low back pain: Secondary | ICD-10-CM | POA: Diagnosis not present

## 2020-09-11 DIAGNOSIS — M545 Low back pain, unspecified: Secondary | ICD-10-CM | POA: Diagnosis not present

## 2020-09-11 DIAGNOSIS — M25551 Pain in right hip: Secondary | ICD-10-CM

## 2020-09-11 DIAGNOSIS — M25751 Osteophyte, right hip: Secondary | ICD-10-CM | POA: Diagnosis not present

## 2020-09-29 DIAGNOSIS — M1030 Gout due to renal impairment, unspecified site: Secondary | ICD-10-CM | POA: Diagnosis not present

## 2020-09-29 DIAGNOSIS — M1830 Unilateral post-traumatic osteoarthritis of first carpometacarpal joint, unspecified hand: Secondary | ICD-10-CM | POA: Diagnosis not present

## 2020-09-29 DIAGNOSIS — E1122 Type 2 diabetes mellitus with diabetic chronic kidney disease: Secondary | ICD-10-CM | POA: Diagnosis not present

## 2020-09-29 DIAGNOSIS — K625 Hemorrhage of anus and rectum: Secondary | ICD-10-CM | POA: Diagnosis not present

## 2020-09-30 DIAGNOSIS — M5136 Other intervertebral disc degeneration, lumbar region: Secondary | ICD-10-CM | POA: Diagnosis not present

## 2020-09-30 DIAGNOSIS — M5416 Radiculopathy, lumbar region: Secondary | ICD-10-CM | POA: Diagnosis not present

## 2020-10-07 DIAGNOSIS — M25551 Pain in right hip: Secondary | ICD-10-CM | POA: Diagnosis not present

## 2020-10-07 DIAGNOSIS — M25651 Stiffness of right hip, not elsewhere classified: Secondary | ICD-10-CM | POA: Diagnosis not present

## 2020-10-07 DIAGNOSIS — M545 Low back pain, unspecified: Secondary | ICD-10-CM | POA: Diagnosis not present

## 2020-10-07 DIAGNOSIS — M6281 Muscle weakness (generalized): Secondary | ICD-10-CM | POA: Diagnosis not present

## 2020-10-07 DIAGNOSIS — M2569 Stiffness of other specified joint, not elsewhere classified: Secondary | ICD-10-CM | POA: Diagnosis not present

## 2020-10-08 DIAGNOSIS — M25651 Stiffness of right hip, not elsewhere classified: Secondary | ICD-10-CM | POA: Diagnosis not present

## 2020-10-08 DIAGNOSIS — M6281 Muscle weakness (generalized): Secondary | ICD-10-CM | POA: Diagnosis not present

## 2020-10-08 DIAGNOSIS — M2569 Stiffness of other specified joint, not elsewhere classified: Secondary | ICD-10-CM | POA: Diagnosis not present

## 2020-10-08 DIAGNOSIS — M545 Low back pain, unspecified: Secondary | ICD-10-CM | POA: Diagnosis not present

## 2020-10-08 DIAGNOSIS — M25551 Pain in right hip: Secondary | ICD-10-CM | POA: Diagnosis not present

## 2020-10-13 ENCOUNTER — Other Ambulatory Visit: Payer: Self-pay | Admitting: Sports Medicine

## 2020-10-13 DIAGNOSIS — M5136 Other intervertebral disc degeneration, lumbar region: Secondary | ICD-10-CM

## 2020-10-14 DIAGNOSIS — M6281 Muscle weakness (generalized): Secondary | ICD-10-CM | POA: Diagnosis not present

## 2020-10-14 DIAGNOSIS — M545 Low back pain, unspecified: Secondary | ICD-10-CM | POA: Diagnosis not present

## 2020-10-14 DIAGNOSIS — M2569 Stiffness of other specified joint, not elsewhere classified: Secondary | ICD-10-CM | POA: Diagnosis not present

## 2020-10-14 DIAGNOSIS — M25651 Stiffness of right hip, not elsewhere classified: Secondary | ICD-10-CM | POA: Diagnosis not present

## 2020-10-14 DIAGNOSIS — M25551 Pain in right hip: Secondary | ICD-10-CM | POA: Diagnosis not present

## 2020-10-16 DIAGNOSIS — M2569 Stiffness of other specified joint, not elsewhere classified: Secondary | ICD-10-CM | POA: Diagnosis not present

## 2020-10-16 DIAGNOSIS — M25551 Pain in right hip: Secondary | ICD-10-CM | POA: Diagnosis not present

## 2020-10-16 DIAGNOSIS — M6281 Muscle weakness (generalized): Secondary | ICD-10-CM | POA: Diagnosis not present

## 2020-10-16 DIAGNOSIS — M545 Low back pain, unspecified: Secondary | ICD-10-CM | POA: Diagnosis not present

## 2020-10-16 DIAGNOSIS — M25651 Stiffness of right hip, not elsewhere classified: Secondary | ICD-10-CM | POA: Diagnosis not present

## 2020-10-22 ENCOUNTER — Other Ambulatory Visit: Payer: Self-pay

## 2020-10-22 ENCOUNTER — Encounter: Payer: Self-pay | Admitting: Internal Medicine

## 2020-10-22 ENCOUNTER — Ambulatory Visit: Payer: Medicare Other | Admitting: Internal Medicine

## 2020-10-22 VITALS — BP 153/73 | HR 65 | Temp 96.9°F | Ht <= 58 in | Wt 190.0 lb

## 2020-10-22 DIAGNOSIS — K625 Hemorrhage of anus and rectum: Secondary | ICD-10-CM

## 2020-10-22 DIAGNOSIS — Z86010 Personal history of colonic polyps: Secondary | ICD-10-CM

## 2020-10-22 NOTE — Progress Notes (Signed)
Referring Provider: Iona Beard, MD Primary Care Physician:  Iona Beard, MD Primary GI:  Dr. Abbey Chatters  Chief Complaint  Patient presents with  . Rectal Bleeding    Bright red, x3-4 months, last tcs 18 yrs ago    HPI:   Rebekah Ray is a 64 y.o. female who presents to the clinic today by referral from her PCP Dr. Berdine Addison for evaluation. Patient states for the last 3 to 4 months she has had rectal bleeding. Describes bright red blood per rectum. No clots. Happens with bowel movements but as well will have occasional leakage. Last colonoscopy she states was 18 years ago when she had polyps removed. I do not have access to these records however. No family history of colorectal malignancy. No unintentional weight loss. No abdominal pain. Denies any upper GI symptoms including dysphagia/odynophagia, reflux, heartburn. No chronic NSAID use. No blood thinners. Does take a daily aspirin. Otherwise she has no other complaints.  Past Medical History:  Diagnosis Date  . COPD (chronic obstructive pulmonary disease) ([place])   . Diabetes mellitus without complication ([place])   . Hypertension   . Renal disorder   . Thyroid disease     Past Surgical History:  Procedure Laterality Date  . BREAST BIOPSY Right   . CESAREAN SECTION    . FOOT SURGERY    . TUBAL LIGATION      Current Outpatient Medications  Medication Sig Dispense Refill  . acetaminophen (TYLENOL) 500 MG tablet Take 500 mg by mouth every 6 (six) hours as needed for mild pain.    Marland Kitchen albuterol (PROVENTIL HFA;VENTOLIN HFA) 108 (90 BASE) MCG/ACT inhaler Inhale 2 puffs into the lungs every 6 (six) hours as needed for wheezing or shortness of breath. 1 Inhaler 5  . albuterol (PROVENTIL) (2.5 MG/3ML) 0.083% nebulizer solution Take 2.5 mg by nebulization every 6 (six) hours as needed for wheezing or shortness of breath.    Marland Kitchen aspirin EC 81 MG tablet Take 81 mg by mouth daily.    Marland Kitchen atorvastatin (LIPITOR) 20 MG tablet Take 20 mg by mouth at  bedtime.    . carvedilol (COREG) 12.5 MG tablet Take 25 mg by mouth 2 (two) times daily with a meal.     . fenofibrate (TRICOR) 48 MG tablet Take 1 tablet by mouth daily.    . ferrous sulfate 325 (65 FE) MG tablet Take 325 mg by mouth 2 (two) times a week.    . furosemide (LASIX) 20 MG tablet Take 1 tablet (20 mg total) by mouth daily as needed for fluid. (Patient taking differently: Take 20-40 mg by mouth daily as needed for fluid. Takes 1 tablet daily and extra tablet if needed) 30 tablet 0  . hydrALAZINE (APRESOLINE) 50 MG tablet Take 1 tablet (50 mg total) by mouth every 8 (eight) hours. 90 tablet 0  . insulin glargine (LANTUS) 100 UNIT/ML injection Inject 47 Units into the skin in the morning. Self adjusts    . levothyroxine (SYNTHROID, LEVOTHROID) 50 MCG tablet Take 50 mcg by mouth daily.    . methocarbamol (ROBAXIN) 500 MG tablet Take 500 mg by mouth 2 (two) times daily.    . sitaGLIPtin (JANUVIA) 100 MG tablet Take 50 mg by mouth daily.    . betamethasone dipropionate 0.05 % cream Apply topically 2 (two) times daily. (Patient not taking: Reported on 10/22/2020)    . fluticasone (CUTIVATE) 0.05 % cream Apply topically 2 (two) times daily. (Patient not taking: Reported on 10/22/2020)  No current facility-administered medications for this visit.    Allergies as of 10/22/2020 - Review Complete 10/22/2020  Allergen Reaction Noted  . Norvasc [amlodipine besylate]  03/14/2020    Family History  Problem Relation Age of Onset  . Ovarian cancer Mother   . Kidney disease Mother   . Heart Problems Mother   . Kidney disease Father   . Diabetes Father     Social History   Socioeconomic History  . Marital status: Divorced    Spouse name: Not on file  . Number of children: Not on file  . Years of education: Not on file  . Highest education level: Not on file  Occupational History  . Not on file  Tobacco Use  . Smoking status: Former Smoker    Packs/day: 2.00    Years: 42.00     Pack years: 84.00    Types: Cigarettes    Quit date: 01/22/2014    Years since quitting: 6.7  . Smokeless tobacco: Former Network engineer and Sexual Activity  . Alcohol use: No  . Drug use: No  . Sexual activity: Never  Other Topics Concern  . Not on file  Social History Narrative  . Not on file   Social Determinants of Health   Financial Resource Strain: Not on file  Food Insecurity: Not on file  Transportation Needs: Not on file  Physical Activity: Not on file  Stress: Not on file  Social Connections: Not on file    Subjective: Review of Systems  Constitutional: Negative for chills and fever.  HENT: Negative for congestion and hearing loss.   Eyes: Negative for blurred vision and double vision.  Respiratory: Negative for cough and shortness of breath.   Cardiovascular: Negative for chest pain and palpitations.  Gastrointestinal: Positive for blood in stool. Negative for abdominal pain, constipation, diarrhea, heartburn, melena and vomiting.  Genitourinary: Negative for dysuria and urgency.  Musculoskeletal: Negative for joint pain and myalgias.  Skin: Negative for itching and rash.  Neurological: Negative for dizziness and headaches.  Psychiatric/Behavioral: Negative for depression. The patient is not nervous/anxious.      Objective: BP (!) 153/73   Pulse 65   Temp (!) 96.9 F (36.1 C) (Temporal)   Ht 4\' 10"  (1.473 m)   Wt 190 lb (86.2 kg)   BMI 39.71 kg/m  Physical Exam Constitutional:      Appearance: Normal appearance.  HENT:     Head: Normocephalic and atraumatic.  Eyes:     Extraocular Movements: Extraocular movements intact.     Conjunctiva/sclera: Conjunctivae normal.  Cardiovascular:     Rate and Rhythm: Normal rate and regular rhythm.     Heart sounds: Murmur heard.    Pulmonary:     Effort: Pulmonary effort is normal.     Breath sounds: Normal breath sounds.  Abdominal:     General: Bowel sounds are normal.     Palpations: Abdomen is soft.   Musculoskeletal:        General: No swelling. Normal range of motion.     Cervical back: Normal range of motion and neck supple.  Skin:    General: Skin is warm and dry.     Coloration: Skin is not jaundiced.  Neurological:     General: No focal deficit present.     Mental Status: She is alert and oriented to person, place, and time.  Psychiatric:        Mood and Affect: Mood normal.  Behavior: Behavior normal.      Assessment: *Rectal bleeding-new, worsening *History of colon polyps  Plan: Etiology of patient's bleeding unclear. Likely hemorrhoidal but will schedule for colonoscopy to rule out other causes such as AVMs, diverticulosis, underlying phonatory bowel disease, polyps, malignancy, or other.  The risks including infection, bleed, or perforation as well as benefits, limitations, alternatives and imponderables have been reviewed with the patient. Questions have been answered. All parties agreeable.  If no other identifiable source of bleeding found, can consider hemorrhoid banding in the outpatient setting.  Thank you Dr. Berdine Addison for the kind referral.  10/22/2020 1:49 PM   Disclaimer: This note was dictated with voice recognition software. Similar sounding words can inadvertently be transcribed and may not be corrected upon review.

## 2020-10-22 NOTE — H&P (View-Only) (Signed)
Referring Provider: Iona Beard, MD Primary Care Physician:  Iona Beard, MD Primary GI:  Dr. Abbey Chatters  Chief Complaint  Patient presents with  . Rectal Bleeding    Bright red, x3-4 months, last tcs 18 yrs ago    HPI:   Rebekah Ray is a 64 y.o. female who presents to the clinic today by referral from her PCP Dr. Berdine Addison for evaluation. Patient states for the last 3 to 4 months she has had rectal bleeding. Describes bright red blood per rectum. No clots. Happens with bowel movements but as well will have occasional leakage. Last colonoscopy she states was 18 years ago when she had polyps removed. I do not have access to these records however. No family history of colorectal malignancy. No unintentional weight loss. No abdominal pain. Denies any upper GI symptoms including dysphagia/odynophagia, reflux, heartburn. No chronic NSAID use. No blood thinners. Does take a daily aspirin. Otherwise she has no other complaints.  Past Medical History:  Diagnosis Date  . COPD (chronic obstructive pulmonary disease) ([place])   . Diabetes mellitus without complication ([place])   . Hypertension   . Renal disorder   . Thyroid disease     Past Surgical History:  Procedure Laterality Date  . BREAST BIOPSY Right   . CESAREAN SECTION    . FOOT SURGERY    . TUBAL LIGATION      Current Outpatient Medications  Medication Sig Dispense Refill  . acetaminophen (TYLENOL) 500 MG tablet Take 500 mg by mouth every 6 (six) hours as needed for mild pain.    Marland Kitchen albuterol (PROVENTIL HFA;VENTOLIN HFA) 108 (90 BASE) MCG/ACT inhaler Inhale 2 puffs into the lungs every 6 (six) hours as needed for wheezing or shortness of breath. 1 Inhaler 5  . albuterol (PROVENTIL) (2.5 MG/3ML) 0.083% nebulizer solution Take 2.5 mg by nebulization every 6 (six) hours as needed for wheezing or shortness of breath.    Marland Kitchen aspirin EC 81 MG tablet Take 81 mg by mouth daily.    Marland Kitchen atorvastatin (LIPITOR) 20 MG tablet Take 20 mg by mouth at  bedtime.    . carvedilol (COREG) 12.5 MG tablet Take 25 mg by mouth 2 (two) times daily with a meal.     . fenofibrate (TRICOR) 48 MG tablet Take 1 tablet by mouth daily.    . ferrous sulfate 325 (65 FE) MG tablet Take 325 mg by mouth 2 (two) times a week.    . furosemide (LASIX) 20 MG tablet Take 1 tablet (20 mg total) by mouth daily as needed for fluid. (Patient taking differently: Take 20-40 mg by mouth daily as needed for fluid. Takes 1 tablet daily and extra tablet if needed) 30 tablet 0  . hydrALAZINE (APRESOLINE) 50 MG tablet Take 1 tablet (50 mg total) by mouth every 8 (eight) hours. 90 tablet 0  . insulin glargine (LANTUS) 100 UNIT/ML injection Inject 47 Units into the skin in the morning. Self adjusts    . levothyroxine (SYNTHROID, LEVOTHROID) 50 MCG tablet Take 50 mcg by mouth daily.    . methocarbamol (ROBAXIN) 500 MG tablet Take 500 mg by mouth 2 (two) times daily.    . sitaGLIPtin (JANUVIA) 100 MG tablet Take 50 mg by mouth daily.    . betamethasone dipropionate 0.05 % cream Apply topically 2 (two) times daily. (Patient not taking: Reported on 10/22/2020)    . fluticasone (CUTIVATE) 0.05 % cream Apply topically 2 (two) times daily. (Patient not taking: Reported on 10/22/2020)  No current facility-administered medications for this visit.    Allergies as of 10/22/2020 - Review Complete 10/22/2020  Allergen Reaction Noted  . Norvasc [amlodipine besylate]  03/14/2020    Family History  Problem Relation Age of Onset  . Ovarian cancer Mother   . Kidney disease Mother   . Heart Problems Mother   . Kidney disease Father   . Diabetes Father     Social History   Socioeconomic History  . Marital status: Divorced    Spouse name: Not on file  . Number of children: Not on file  . Years of education: Not on file  . Highest education level: Not on file  Occupational History  . Not on file  Tobacco Use  . Smoking status: Former Smoker    Packs/day: 2.00    Years: 42.00     Pack years: 84.00    Types: Cigarettes    Quit date: 01/22/2014    Years since quitting: 6.7  . Smokeless tobacco: Former Network engineer and Sexual Activity  . Alcohol use: No  . Drug use: No  . Sexual activity: Never  Other Topics Concern  . Not on file  Social History Narrative  . Not on file   Social Determinants of Health   Financial Resource Strain: Not on file  Food Insecurity: Not on file  Transportation Needs: Not on file  Physical Activity: Not on file  Stress: Not on file  Social Connections: Not on file    Subjective: Review of Systems  Constitutional: Negative for chills and fever.  HENT: Negative for congestion and hearing loss.   Eyes: Negative for blurred vision and double vision.  Respiratory: Negative for cough and shortness of breath.   Cardiovascular: Negative for chest pain and palpitations.  Gastrointestinal: Positive for blood in stool. Negative for abdominal pain, constipation, diarrhea, heartburn, melena and vomiting.  Genitourinary: Negative for dysuria and urgency.  Musculoskeletal: Negative for joint pain and myalgias.  Skin: Negative for itching and rash.  Neurological: Negative for dizziness and headaches.  Psychiatric/Behavioral: Negative for depression. The patient is not nervous/anxious.      Objective: BP (!) 153/73   Pulse 65   Temp (!) 96.9 F (36.1 C) (Temporal)   Ht 4\' 10"  (1.473 m)   Wt 190 lb (86.2 kg)   BMI 39.71 kg/m  Physical Exam Constitutional:      Appearance: Normal appearance.  HENT:     Head: Normocephalic and atraumatic.  Eyes:     Extraocular Movements: Extraocular movements intact.     Conjunctiva/sclera: Conjunctivae normal.  Cardiovascular:     Rate and Rhythm: Normal rate and regular rhythm.     Heart sounds: Murmur heard.    Pulmonary:     Effort: Pulmonary effort is normal.     Breath sounds: Normal breath sounds.  Abdominal:     General: Bowel sounds are normal.     Palpations: Abdomen is soft.   Musculoskeletal:        General: No swelling. Normal range of motion.     Cervical back: Normal range of motion and neck supple.  Skin:    General: Skin is warm and dry.     Coloration: Skin is not jaundiced.  Neurological:     General: No focal deficit present.     Mental Status: She is alert and oriented to person, place, and time.  Psychiatric:        Mood and Affect: Mood normal.  Behavior: Behavior normal.      Assessment: *Rectal bleeding-new, worsening *History of colon polyps  Plan: Etiology of patient's bleeding unclear. Likely hemorrhoidal but will schedule for colonoscopy to rule out other causes such as AVMs, diverticulosis, underlying phonatory bowel disease, polyps, malignancy, or other.  The risks including infection, bleed, or perforation as well as benefits, limitations, alternatives and imponderables have been reviewed with the patient. Questions have been answered. All parties agreeable.  If no other identifiable source of bleeding found, can consider hemorrhoid banding in the outpatient setting.  Thank you Dr. Berdine Addison for the kind referral.  10/22/2020 1:49 PM   Disclaimer: This note was dictated with voice recognition software. Similar sounding words can inadvertently be transcribed and may not be corrected upon review.

## 2020-10-22 NOTE — Patient Instructions (Signed)
We will schedule you for colonoscopy to further evaluate your rectal bleeding.  If no other identifiable source found besides hemorrhoids we will consider hemorrhoid banding in the outpatient setting.  Further recommendations to follow  At [facility] we value your feedback. You may receive a survey about your visit today. Please share your experience as we strive to create trusting relationships with our patients to provide genuine, compassionate, quality care.  We appreciate your understanding and patience as we review any laboratory studies, imaging, and other diagnostic tests that are ordered as we care for you. Our office policy is 5 business days for review of these results, and any emergent or urgent results are addressed in a timely manner for your best interest. If you do not hear from our office in 1 week, please contact us.   We also encourage the use of MyChart, which contains your medical information for your review as well. If you are not enrolled in this feature, an access code is on this after visit summary for your convenience. Thank you for allowing [place] to be involved in your care.  It was great to see you today!  I hope you have a great rest of your winter!!    Rebekah Ray. Abbey Chatters, D.O. Gastroenterology and Hepatology [facility]

## 2020-10-23 ENCOUNTER — Ambulatory Visit
Admission: RE | Admit: 2020-10-23 | Discharge: 2020-10-23 | Disposition: A | Discharge status: Home / Self Care | Payer: Medicare Other | Source: Ambulatory Visit | Attending: Sports Medicine | Admitting: Sports Medicine

## 2020-10-23 DIAGNOSIS — M545 Low back pain, unspecified: Secondary | ICD-10-CM | POA: Diagnosis not present

## 2020-10-23 DIAGNOSIS — M5136 Other intervertebral disc degeneration, lumbar region: Secondary | ICD-10-CM

## 2020-10-23 DIAGNOSIS — M48061 Spinal stenosis, lumbar region without neurogenic claudication: Secondary | ICD-10-CM | POA: Diagnosis not present

## 2020-10-27 DIAGNOSIS — K625 Hemorrhage of anus and rectum: Secondary | ICD-10-CM | POA: Diagnosis not present

## 2020-10-27 DIAGNOSIS — J069 Acute upper respiratory infection, unspecified: Secondary | ICD-10-CM | POA: Diagnosis not present

## 2020-10-28 DIAGNOSIS — M2569 Stiffness of other specified joint, not elsewhere classified: Secondary | ICD-10-CM | POA: Diagnosis not present

## 2020-10-28 DIAGNOSIS — M25651 Stiffness of right hip, not elsewhere classified: Secondary | ICD-10-CM | POA: Diagnosis not present

## 2020-10-28 DIAGNOSIS — M6281 Muscle weakness (generalized): Secondary | ICD-10-CM | POA: Diagnosis not present

## 2020-10-28 DIAGNOSIS — M545 Low back pain, unspecified: Secondary | ICD-10-CM | POA: Diagnosis not present

## 2020-10-28 DIAGNOSIS — M25551 Pain in right hip: Secondary | ICD-10-CM | POA: Diagnosis not present

## 2020-10-30 DIAGNOSIS — M25551 Pain in right hip: Secondary | ICD-10-CM | POA: Diagnosis not present

## 2020-10-30 DIAGNOSIS — M25651 Stiffness of right hip, not elsewhere classified: Secondary | ICD-10-CM | POA: Diagnosis not present

## 2020-10-30 DIAGNOSIS — M545 Low back pain, unspecified: Secondary | ICD-10-CM | POA: Diagnosis not present

## 2020-10-30 DIAGNOSIS — M2569 Stiffness of other specified joint, not elsewhere classified: Secondary | ICD-10-CM | POA: Diagnosis not present

## 2020-10-30 DIAGNOSIS — M6281 Muscle weakness (generalized): Secondary | ICD-10-CM | POA: Diagnosis not present

## 2020-11-03 DIAGNOSIS — M25551 Pain in right hip: Secondary | ICD-10-CM | POA: Diagnosis not present

## 2020-11-03 DIAGNOSIS — M2569 Stiffness of other specified joint, not elsewhere classified: Secondary | ICD-10-CM | POA: Diagnosis not present

## 2020-11-03 DIAGNOSIS — M6281 Muscle weakness (generalized): Secondary | ICD-10-CM | POA: Diagnosis not present

## 2020-11-03 DIAGNOSIS — M545 Low back pain, unspecified: Secondary | ICD-10-CM | POA: Diagnosis not present

## 2020-11-03 DIAGNOSIS — M25651 Stiffness of right hip, not elsewhere classified: Secondary | ICD-10-CM | POA: Diagnosis not present

## 2020-11-04 NOTE — Patient Instructions (Signed)
Rebekah Ray  11/04/2020     @PREFPERIOPPHARMACY @   Your procedure is scheduled on 11/11/2020.  Report to [facility] at 12:30 PM  Call this number if you have problems the morning of surgery:  [phone]   Remember:  Do not eat or drink after midnight.     Please follow the diet and prep instructions given to you by the doctor's office     Take these medicines the morning of surgery with A SIP OF WATER : Carvedilol and Apresoline    Do not wear jewelry, make-up or nail polish.  Do not wear lotions, powders, or perfumes, or deodorant.  Do not shave 48 hours prior to surgery.  Men may shave face and neck.  Do not bring valuables to the hospital.  [facility] is not responsible for any belongings or valuables.  Contacts, dentures or bridgework may not be worn into surgery.  Leave your suitcase in the car.  After surgery it may be brought to your room.  For patients admitted to the hospital, discharge time will be determined by your treatment team.  Patients discharged the day of surgery will not be allowed to drive home.   Name and phone number of your driver:   family Special instructions:  n/a  Please read over the following fact sheets that you were given. Care and Recovery After Surgery    Colonoscopy, Adult A colonoscopy is a procedure to look at the entire large intestine. This procedure is done using a long, thin, flexible tube that has a camera on the end. You may have a colonoscopy:  As a part of normal colorectal screening.  If you have certain symptoms, such as: ? A low number of red blood cells in your blood (anemia). ? Diarrhea that does not go away. ? Pain in your abdomen. ? Blood in your stool. A colonoscopy can help screen for and diagnose medical problems, including:  Tumors.  Extra tissue that grows where mucus forms (polyps).  Inflammation.  Areas of bleeding. Tell your health care provider about:  Any allergies you have.  All  medicines you are taking, including vitamins, herbs, eye drops, creams, and over-the-counter medicines.  Any problems you or family members have had with anesthetic medicines.  Any blood disorders you have.  Any surgeries you have had.  Any medical conditions you have.  Any problems you have had with having bowel movements.  Whether you are pregnant or may be pregnant. What are the risks? Generally, this is a safe procedure. However, problems may occur, including:  Bleeding.  Damage to your intestine.  Allergic reactions to medicines given during the procedure.  Infection. This is rare. What happens before the procedure? Eating and drinking restrictions Follow instructions from your health care provider about eating or drinking restrictions, which may include:  A few days before the procedure: ? Follow a low-fiber diet. ? Avoid nuts, seeds, dried fruit, raw fruits, and vegetables.  1-3 days before the procedure: ? Eat only gelatin dessert or ice pops. ? Drink only clear liquids, such as water, clear juice, clear broth or bouillon, black coffee or tea, or clear soft drinks or sports drinks. ? Avoid liquids that contain red or purple dye.  The day of the procedure: ? Do not eat solid foods. You may continue to drink clear liquids until up to 2 hours before the procedure. ? Do not eat or drink anything starting 2 hours before the procedure, or within the time  period that your health care provider recommends. Bowel prep If you were prescribed a bowel prep to take by mouth (orally) to clean out your colon:  Take it as told by your health care provider. Starting the day before your procedure, you will need to drink a large amount of liquid medicine. The liquid will cause you to have many bowel movements of loose stool until your stool becomes almost clear or light green.  If your skin or the opening between the buttocks (anus) gets irritated from diarrhea, you may relieve the  irritation using: ? Wipes with medicine in them, such as adult wet wipes with aloe and vitamin E. ? A product to soothe skin, such as petroleum jelly.  If you vomit while drinking the bowel prep: ? Take a break for up to 60 minutes. ? Begin the bowel prep again. ? Call your health care provider if you keep vomiting or you cannot take the bowel prep without vomiting.  To clean out your colon, you may also be given: ? Laxative medicines. These help you have a bowel movement. ? Instructions for enema use. An enema is liquid medicine injected into your rectum. Medicines Ask your health care provider about:  Changing or stopping your regular medicines or supplements. This is especially important if you are taking iron supplements, diabetes medicines, or blood thinners.  Taking medicines such as aspirin and ibuprofen. These medicines can thin your blood. Do not take these medicines unless your health care provider tells you to take them.  Taking over-the-counter medicines, vitamins, herbs, and supplements. General instructions  Ask your health care provider what steps will be taken to help prevent infection. These may include washing skin with a germ-killing soap.  Plan to have someone take you home from the hospital or clinic. What happens during the procedure?   An IV will be inserted into one of your veins.  You may be given one or more of the following: ? A medicine to help you relax (sedative). ? A medicine to numb the area (local anesthetic). ? A medicine to make you fall asleep (general anesthetic). This is rarely needed.  You will lie on your side with your knees bent.  The tube will: ? Have oil or gel put on it (be lubricated). ? Be inserted into your anus. ? Be gently eased through all parts of your large intestine.  Air will be sent into your colon to keep it open. This may cause some pressure or cramping.  Images will be taken with the camera and will appear on a  screen.  A small tissue sample may be removed to be looked at under a microscope (biopsy). The tissue may be sent to a lab for testing if any signs of problems are found.  If small polyps are found, they may be removed and checked for cancer cells.  When the procedure is finished, the tube will be removed. The procedure may vary among health care providers and hospitals. What happens after the procedure?  Your blood pressure, heart rate, breathing rate, and blood oxygen level will be monitored until you leave the hospital or clinic.  You may have a small amount of blood in your stool.  You may pass gas and have mild cramping or bloating in your abdomen. This is caused by the air that was used to open your colon during the exam.  Do not drive for 24 hours after the procedure.  It is up to you to get the  results of your procedure. Ask your health care provider, or the department that is doing the procedure, when your results will be ready. Summary  A colonoscopy is a procedure to look at the entire large intestine.  Follow instructions from your health care provider about eating and drinking before the procedure.  If you were prescribed an oral bowel prep to clean out your colon, take it as told by your health care provider.  During the colonoscopy, a flexible tube with a camera on its end is inserted into the anus and then passed into the other parts of the large intestine. This information is not intended to replace advice given to you by your health care provider. Make sure you discuss any questions you have with your health care provider. Document Revised: 05/18/2019 Document Reviewed: 05/18/2019 Elsevier Patient Education  2020 [place] Anesthesia is a term that refers to techniques, procedures, and medicines that help a person stay safe and comfortable during a medical procedure. Monitored anesthesia care, or sedation, is one type of anesthesia.  Your anesthesia specialist may recommend sedation if you will be having a procedure that does not require you to be unconscious, such as:  Cataract surgery.  A dental procedure.  A biopsy.  A colonoscopy. During the procedure, you may receive a medicine to help you relax (sedative). There are three levels of sedation:  Mild sedation. At this level, you may feel awake and relaxed. You will be able to follow directions.  Moderate sedation. At this level, you will be sleepy. You may not remember the procedure.  Deep sedation. At this level, you will be asleep. You will not remember the procedure. The more medicine you are given, the deeper your level of sedation will be. Depending on how you respond to the procedure, the anesthesia specialist may change your level of sedation or the type of anesthesia to fit your needs. An anesthesia specialist will monitor you closely during the procedure. Let your health care provider know about:  Any allergies you have.  All medicines you are taking, including vitamins, herbs, eye drops, creams, and over-the-counter medicines.  Any use of steroids (by mouth or as a cream).  Any problems you or family members have had with sedatives and anesthetic medicines.  Any blood disorders you have.  Any surgeries you have had.  Any medical conditions you have, such as sleep apnea.  Whether you are pregnant or may be pregnant.  Any use of cigarettes, alcohol, or street drugs. What are the risks? Generally, this is a safe procedure. However, problems may occur, including:  Getting too much medicine (oversedation).  Nausea.  Allergic reaction to medicines.  Trouble breathing. If this happens, a breathing tube may be used to help with breathing. It will be removed when you are awake and breathing on your own.  Heart trouble.  Lung trouble. Before the procedure Staying hydrated Follow instructions from your health care provider about hydration,  which may include:  Up to 2 hours before the procedure - you may continue to drink clear liquids, such as water, clear fruit juice, black coffee, and plain tea. Eating and drinking restrictions Follow instructions from your health care provider about eating and drinking, which may include:  8 hours before the procedure - stop eating heavy meals or foods such as meat, fried foods, or fatty foods.  6 hours before the procedure - stop eating light meals or foods, such as toast or cereal.  6  hours before the procedure - stop drinking milk or drinks that contain milk.  2 hours before the procedure - stop drinking clear liquids. Medicines Ask your health care provider about:  Changing or stopping your regular medicines. This is especially important if you are taking diabetes medicines or blood thinners.  Taking medicines such as aspirin and ibuprofen. These medicines can thin your blood. Do not take these medicines before your procedure if your health care provider instructs you not to. Tests and exams  You will have a physical exam.  You may have blood tests done to show: ? How well your kidneys and liver are working. ? How well your blood can clot. General instructions  Plan to have someone take you home from the hospital or clinic.  If you will be going home right after the procedure, plan to have someone with you for 24 hours.  What happens during the procedure?  Your blood pressure, heart rate, breathing, level of pain and overall condition will be monitored.  An IV tube will be inserted into one of your veins.  Your anesthesia specialist will give you medicines as needed to keep you comfortable during the procedure. This may mean changing the level of sedation.  The procedure will be performed. After the procedure  Your blood pressure, heart rate, breathing rate, and blood oxygen level will be monitored until the medicines you were given have worn off.  Do not drive for  24 hours if you received a sedative.  You may: ? Feel sleepy, clumsy, or nauseous. ? Feel forgetful about what happened after the procedure. ? Have a sore throat if you had a breathing tube during the procedure. ? Vomit. This information is not intended to replace advice given to you by your health care provider. Make sure you discuss any questions you have with your health care provider. Document Revised: 10/07/2017 Document Reviewed: 02/15/2016 Elsevier Patient Education  [place].

## 2020-11-06 ENCOUNTER — Encounter (HOSPITAL_COMMUNITY): Payer: Self-pay

## 2020-11-06 ENCOUNTER — Other Ambulatory Visit: Payer: Self-pay

## 2020-11-06 ENCOUNTER — Encounter (HOSPITAL_COMMUNITY)
Admission: RE | Admit: 2020-11-06 | Discharge: 2020-11-06 | Disposition: A | Discharge status: Home / Self Care | Payer: Medicare Other | Source: Ambulatory Visit | Attending: Internal Medicine | Admitting: Internal Medicine

## 2020-11-06 DIAGNOSIS — Z01818 Encounter for other preprocedural examination: Secondary | ICD-10-CM | POA: Insufficient documentation

## 2020-11-06 DIAGNOSIS — I10 Essential (primary) hypertension: Secondary | ICD-10-CM | POA: Diagnosis not present

## 2020-11-06 LAB — BASIC METABOLIC PANEL
Anion gap: 10 (ref 5–15)
BUN: 34 mg/dL — ABNORMAL HIGH (ref 8–23)
CO2: 23 mmol/L (ref 22–32)
Calcium: 9.1 mg/dL (ref 8.9–10.3)
Chloride: 104 mmol/L (ref 98–111)
Creatinine, Ser: 1.61 mg/dL — ABNORMAL HIGH (ref 0.44–1.00)
GFR, Estimated: 35 mL/min — ABNORMAL LOW (ref >60)
Glucose, Bld: 125 mg/dL — ABNORMAL HIGH (ref 70–99)
Potassium: 3.8 mmol/L (ref 3.5–5.1)
Sodium: 137 mmol/L (ref 135–145)

## 2020-11-06 LAB — CBC WITH DIFFERENTIAL/PLATELET
Abs Immature Granulocytes: 0.03 10*3/uL (ref 0.00–0.07)
Basophils Absolute: 0 10*3/uL (ref 0.0–0.1)
Basophils Relative: 0 %
Eosinophils Absolute: 0.3 10*3/uL (ref 0.0–0.5)
Eosinophils Relative: 4 %
HCT: 34.6 % — ABNORMAL LOW (ref 36.0–46.0)
Hemoglobin: 11.1 g/dL — ABNORMAL LOW (ref 12.0–15.0)
Immature Granulocytes: 0 %
Lymphocytes Relative: 28 %
Lymphs Abs: 1.9 10*3/uL (ref 0.7–4.0)
MCH: 28 pg (ref 26.0–34.0)
MCHC: 32.1 g/dL (ref 30.0–36.0)
MCV: 87.4 fL (ref 80.0–100.0)
Monocytes Absolute: 0.5 10*3/uL (ref 0.1–1.0)
Monocytes Relative: 8 %
Neutro Abs: 4.1 10*3/uL (ref 1.7–7.7)
Neutrophils Relative %: 60 %
Platelets: 287 10*3/uL (ref 150–400)
RBC: 3.96 MIL/uL (ref 3.87–5.11)
RDW: 13.7 % (ref 11.5–15.5)
WBC: 6.8 10*3/uL (ref 4.0–10.5)
nRBC: 0 % (ref 0.0–0.2)

## 2020-11-10 ENCOUNTER — Other Ambulatory Visit (HOSPITAL_COMMUNITY)
Admission: RE | Admit: 2020-11-10 | Discharge: 2020-11-10 | Disposition: A | Discharge status: Home / Self Care | Payer: Medicare Other | Source: Ambulatory Visit | Attending: Internal Medicine | Admitting: Internal Medicine

## 2020-11-10 ENCOUNTER — Other Ambulatory Visit: Payer: Self-pay

## 2020-11-10 DIAGNOSIS — Z01812 Encounter for preprocedural laboratory examination: Secondary | ICD-10-CM | POA: Insufficient documentation

## 2020-11-10 DIAGNOSIS — Z20822 Contact with and (suspected) exposure to covid-19: Secondary | ICD-10-CM | POA: Diagnosis not present

## 2020-11-11 ENCOUNTER — Ambulatory Visit (HOSPITAL_COMMUNITY): Payer: Medicare Other | Admitting: Anesthesiology

## 2020-11-11 ENCOUNTER — Encounter (HOSPITAL_COMMUNITY)
Admission: RE | Disposition: A | Discharge status: Home / Self Care | Payer: Self-pay | Source: Home / Self Care | Attending: Internal Medicine

## 2020-11-11 ENCOUNTER — Ambulatory Visit (HOSPITAL_COMMUNITY)
Admission: RE | Admit: 2020-11-11 | Discharge: 2020-11-11 | Disposition: A | Discharge status: Home / Self Care | Payer: Medicare Other | Attending: Internal Medicine | Admitting: Internal Medicine

## 2020-11-11 ENCOUNTER — Encounter (HOSPITAL_COMMUNITY): Payer: Self-pay

## 2020-11-11 DIAGNOSIS — Z86010 Personal history of colonic polyps: Secondary | ICD-10-CM | POA: Diagnosis not present

## 2020-11-11 DIAGNOSIS — Z888 Allergy status to other drugs, medicaments and biological substances status: Secondary | ICD-10-CM | POA: Insufficient documentation

## 2020-11-11 DIAGNOSIS — D123 Benign neoplasm of transverse colon: Secondary | ICD-10-CM | POA: Insufficient documentation

## 2020-11-11 DIAGNOSIS — Z7982 Long term (current) use of aspirin: Secondary | ICD-10-CM | POA: Diagnosis not present

## 2020-11-11 DIAGNOSIS — K648 Other hemorrhoids: Secondary | ICD-10-CM | POA: Diagnosis not present

## 2020-11-11 DIAGNOSIS — Z794 Long term (current) use of insulin: Secondary | ICD-10-CM | POA: Insufficient documentation

## 2020-11-11 DIAGNOSIS — D125 Benign neoplasm of sigmoid colon: Secondary | ICD-10-CM | POA: Diagnosis not present

## 2020-11-11 DIAGNOSIS — E119 Type 2 diabetes mellitus without complications: Secondary | ICD-10-CM | POA: Diagnosis not present

## 2020-11-11 DIAGNOSIS — K5730 Diverticulosis of large intestine without perforation or abscess without bleeding: Secondary | ICD-10-CM | POA: Diagnosis not present

## 2020-11-11 DIAGNOSIS — K625 Hemorrhage of anus and rectum: Secondary | ICD-10-CM | POA: Diagnosis not present

## 2020-11-11 DIAGNOSIS — K635 Polyp of colon: Secondary | ICD-10-CM | POA: Diagnosis not present

## 2020-11-11 DIAGNOSIS — Z79899 Other long term (current) drug therapy: Secondary | ICD-10-CM | POA: Diagnosis not present

## 2020-11-11 DIAGNOSIS — Z79890 Hormone replacement therapy (postmenopausal): Secondary | ICD-10-CM | POA: Diagnosis not present

## 2020-11-11 DIAGNOSIS — J449 Chronic obstructive pulmonary disease, unspecified: Secondary | ICD-10-CM | POA: Diagnosis not present

## 2020-11-11 DIAGNOSIS — D122 Benign neoplasm of ascending colon: Secondary | ICD-10-CM | POA: Insufficient documentation

## 2020-11-11 DIAGNOSIS — Z87891 Personal history of nicotine dependence: Secondary | ICD-10-CM | POA: Diagnosis not present

## 2020-11-11 HISTORY — PX: COLONOSCOPY WITH PROPOFOL: SHX5780

## 2020-11-11 HISTORY — PX: POLYPECTOMY: SHX5525

## 2020-11-11 LAB — SARS CORONAVIRUS 2 (TAT 6-24 HRS): SARS Coronavirus 2: NEGATIVE

## 2020-11-11 LAB — GLUCOSE, CAPILLARY
Glucose-Capillary: 97 mg/dL (ref 70–99)
Glucose-Capillary: 81 mg/dL (ref 70–99)

## 2020-11-11 SURGERY — COLONOSCOPY WITH PROPOFOL
Anesthesia: General

## 2020-11-11 MED ORDER — PROPOFOL 10 MG/ML IV BOLUS
INTRAVENOUS | Status: DC | PRN
  Administered 2020-11-11: 80 mg via INTRAVENOUS

## 2020-11-11 MED ORDER — LIDOCAINE HCL (CARDIAC) PF 100 MG/5ML IV SOSY
PREFILLED_SYRINGE | INTRAVENOUS | Status: DC | PRN
  Administered 2020-11-11: 50 mg via INTRAVENOUS

## 2020-11-11 MED ORDER — LACTATED RINGERS IV SOLN
INTRAVENOUS | Status: DC
  Administered 2020-11-11: 1000 mL via INTRAVENOUS

## 2020-11-11 MED ORDER — PROPOFOL 500 MG/50ML IV EMUL
INTRAVENOUS | Status: DC | PRN
  Administered 2020-11-11: 150 ug/kg/min via INTRAVENOUS

## 2020-11-11 MED ORDER — STERILE WATER FOR IRRIGATION IR SOLN
Status: DC | PRN
  Administered 2020-11-11: 100 mL

## 2020-11-11 NOTE — Interval H&P Note (Signed)
History and Physical Interval Note:  11/11/2020 9:24 AM  Rebekah Ray  has presented today for surgery, with the diagnosis of rectal bleeding, history of polyps.  The various methods of treatment have been discussed with the patient and family. After consideration of risks, benefits and other options for treatment, the patient has consented to  Procedure(s) with comments: COLONOSCOPY WITH PROPOFOL (N/A) - 2:30pm, pt knows to arrive at 9:00 as a surgical intervention.  The patient's history has been reviewed, patient examined, no change in status, stable for surgery.  I have reviewed the patient's chart and labs.  Questions were answered to the patient's satisfaction.     Eloise Harman

## 2020-11-11 NOTE — Anesthesia Postprocedure Evaluation (Signed)
Anesthesia Post Note  Patient: Rebekah Ray  Procedure(s) Performed: COLONOSCOPY WITH PROPOFOL (N/A ) POLYPECTOMY  Patient location during evaluation: PACU Anesthesia Type: General Level of consciousness: awake and alert and oriented Pain management: pain level controlled Vital Signs Assessment: post-procedure vital signs reviewed and stable Respiratory status: spontaneous breathing, nonlabored ventilation and respiratory function stable Cardiovascular status: blood pressure returned to baseline and stable Postop Assessment: no apparent nausea or vomiting Anesthetic complications: no   No complications documented.   Last Vitals:  Vitals:   11/11/20 1045 11/11/20 1100  BP: (!) 158/69 (!) 176/81  Pulse: (!) 55 (!) 56  Resp: 15 16  Temp:    SpO2: 98% 98%    Last Pain:  Vitals:   11/11/20 1100  TempSrc:   PainSc: 0-No pain                 Orlie Dakin

## 2020-11-11 NOTE — Anesthesia Procedure Notes (Signed)
Date/Time: 11/11/2020 10:12 AM Performed by: Orlie Dakin, CRNA Pre-anesthesia Checklist: Patient identified, Emergency Drugs available, Suction available and Patient being monitored Patient Re-evaluated:Patient Re-evaluated prior to induction Oxygen Delivery Method: Nasal cannula Induction Type: IV induction Placement Confirmation: positive ETCO2

## 2020-11-11 NOTE — Op Note (Signed)
[facility] Patient Name: Rebekah Ray Procedure Date: 11/11/2020 9:57 AM MRN: [id number] Date of Birth: [birth year] Attending MD: Elon Alas. Abbey Chatters DO CSN: [id number] Age: 65 Admit Type: Outpatient Procedure:                Colonoscopy Indications:              Rectal bleeding Providers:                Elon Alas. Abbey Chatters, DO, Lambert Mody, Aram Candela Referring MD:              Medicines:                See the Anesthesia note for documentation of the                            administered medications Complications:            No immediate complications. Estimated Blood Loss:     Estimated blood loss was minimal. Procedure:                Pre-Anesthesia Assessment:                           - The anesthesia plan was to use monitored                            anesthesia care (MAC).                           After obtaining informed consent, the colonoscope                            was passed under direct vision. Throughout the                            procedure, the patient's blood pressure, pulse, and                            oxygen saturations were monitored continuously. The                            [id number]-[id number] ([id number]) scope was introduced through                            the anus and advanced to the the cecum, identified                            by appendiceal orifice and ileocecal valve. The                            colonoscopy was performed without difficulty. The                            patient tolerated the procedure well. The quality  of the bowel preparation was evaluated using the                            BBPS [facility] Bowel Preparation Scale) with scores                            of: Right Colon = 2 (minor amount of residual                            staining, small fragments of stool and/or opaque                            liquid, but mucosa seen well), Transverse Colon = 3                             (entire mucosa seen well with no residual staining,                            small fragments of stool or opaque liquid) and Left                            Colon = 3 (entire mucosa seen well with no residual                            staining, small fragments of stool or opaque                            liquid). The total BBPS score equals 8. The quality                            of the bowel preparation was good. Scope In: 10:09:56 AM Scope Out: 10:20:23 AM Scope Withdrawal Time: 0 hours 7 minutes 49 seconds  Total Procedure Duration: 0 hours 10 minutes 27 seconds  Findings:      Non-bleeding internal hemorrhoids were found during retroflexion.      A few small-mouthed diverticula were found in the sigmoid colon.      A 2 mm polyp was found in the ascending colon. The polyp was sessile.       The polyp was removed with a cold biopsy forceps. Resection and       retrieval were complete.      A 4 mm polyp was found in the transverse colon. The polyp was sessile.       The polyp was removed with a cold snare. Resection and retrieval were       complete. Impression:               - Non-bleeding internal hemorrhoids.                           - Diverticulosis in the sigmoid colon.                           - One 2 mm polyp in the ascending colon, removed  with a cold biopsy forceps. Resected and retrieved.                           - One 4 mm polyp in the transverse colon, removed                            with a cold snare. Resected and retrieved. Moderate Sedation:      Per Anesthesia Care Recommendation:           - Patient has a contact number available for                            emergencies. The signs and symptoms of potential                            delayed complications were discussed with the                            patient. Return to normal activities tomorrow.                            Written discharge instructions were  provided to the                            patient.                           - Resume previous diet.                           - Continue present medications.                           - Await pathology results.                           - Repeat colonoscopy in 5 years for surveillance.                           - Return to GI clinic in 3 months with Roseanne Kaufman                            to discuss hemorrhoid banding. Procedure Code(s):        --- Professional ---                           [phone], Colonoscopy, flexible; with removal of                            tumor(s), polyp(s), or other lesion(s) by snare                            technique                           [id number], 22, Colonoscopy, flexible; with biopsy,  single or multiple Diagnosis Code(s):        --- Professional ---                           K64.8, Other hemorrhoids                           K63.5, Polyp of colon                           K62.5, Hemorrhage of anus and rectum                           K57.30, Diverticulosis of large intestine without                            perforation or abscess without bleeding CPT copyright 2019 American Medical Association. All rights reserved. The codes documented in this report are preliminary and upon coder review may  be revised to meet current compliance requirements. Elon Alas. Abbey Chatters, DO Am[place] Abbey Chatters, DO 11/11/2020 10:27:34 AM This report has been signed electronically. Number of Addenda: 0

## 2020-11-11 NOTE — Progress Notes (Signed)
Reviewed discharge education and information. Patient is dressed and ready for discharge, spoke with Asa Saunas, daughter in law, her ride, who stated she will be here in 1 hour to pick up patient.

## 2020-11-11 NOTE — Transfer of Care (Signed)
Immediate Anesthesia Transfer of Care Note  Patient: Rebekah Ray  Procedure(s) Performed: COLONOSCOPY WITH PROPOFOL (N/A ) POLYPECTOMY  Patient Location: PACU  Anesthesia Type:General  Level of Consciousness: awake, alert  and oriented  Airway & Oxygen Therapy: Patient Spontanous Breathing  Post-op Assessment: Report given to RN and Post -op Vital signs reviewed and stable  Post vital signs: Reviewed and stable  Last Vitals:  Vitals Value Taken Time  BP 132/116 11/11/20 1026  Temp    Pulse 69 11/11/20 1027  Resp 19 11/11/20 1027  SpO2 99 % 11/11/20 1027  Vitals shown include unvalidated device data.  Last Pain:  Vitals:   11/11/20 1007  TempSrc:   PainSc: 0-No pain      Patients Stated Pain Goal: 9 (75/43/60 6770)  Complications: No complications documented.

## 2020-11-11 NOTE — Anesthesia Preprocedure Evaluation (Signed)
Anesthesia Evaluation  Patient identified by MRN, date of birth, ID band Patient awake    Reviewed: Allergy & Precautions, H&P , NPO status , Patient's Chart, lab work & pertinent test results, reviewed documented beta blocker date and time   Airway Mallampati: II  TM Distance: >3 FB Neck ROM: full    Dental no notable dental hx. (+) Teeth Intact   Pulmonary COPD, former smoker,    Pulmonary exam normal breath sounds clear to auscultation       Cardiovascular Exercise Tolerance: Good hypertension, +CHF   Rhythm:regular Rate:Normal     Neuro/Psych negative neurological ROS  negative psych ROS   GI/Hepatic negative GI ROS, Neg liver ROS,   Endo/Other  negative endocrine ROSdiabetes  Renal/GU ARFRenal disease  negative genitourinary   Musculoskeletal   Abdominal   Peds  Hematology negative hematology ROS (+)   Anesthesia Other Findings   Reproductive/Obstetrics negative OB ROS                             Anesthesia Physical Anesthesia Plan  ASA: III  Anesthesia Plan: General   Post-op Pain Management:    Induction:   PONV Risk Score and Plan: Propofol infusion  Airway Management Planned:   Additional Equipment:   Intra-op Plan:   Post-operative Plan:   Informed Consent: I have reviewed the patients History and Physical, chart, labs and discussed the procedure including the risks, benefits and alternatives for the proposed anesthesia with the patient or authorized representative who has indicated his/her understanding and acceptance.     Dental Advisory Given  Plan Discussed with: CRNA  Anesthesia Plan Comments:         Anesthesia Quick Evaluation

## 2020-11-11 NOTE — Discharge Instructions (Signed)
  Colonoscopy Discharge Instructions  Read the instructions outlined below and refer to this sheet in the next few weeks. These discharge instructions provide you with general information on caring for yourself after you leave the hospital. Your doctor may also give you specific instructions. While your treatment has been planned according to the most current medical practices available, unavoidable complications occasionally occur.   ACTIVITY  You may resume your regular activity, but move at a slower pace for the next 24 hours.   Take frequent rest periods for the next 24 hours.   Walking will help get rid of the air and reduce the bloated feeling in your belly (abdomen).   No driving for 24 hours (because of the medicine (anesthesia) used during the test).    Do not sign any important legal documents or operate any machinery for 24 hours (because of the anesthesia used during the test).  NUTRITION  Drink plenty of fluids.   You may resume your normal diet as instructed by your doctor.   Begin with a light meal and progress to your normal diet. Heavy or fried foods are harder to digest and may make you feel sick to your stomach (nauseated).   Avoid alcoholic beverages for 24 hours or as instructed.  MEDICATIONS  You may resume your normal medications unless your doctor tells you otherwise.  WHAT YOU CAN EXPECT TODAY  Some feelings of bloating in the abdomen.   Passage of more gas than usual.   Spotting of blood in your stool or on the toilet paper.  IF YOU HAD POLYPS REMOVED DURING THE COLONOSCOPY:  No aspirin products for 7 days or as instructed.   No alcohol for 7 days or as instructed.   Eat a soft diet for the next 24 hours.  FINDING OUT THE RESULTS OF YOUR TEST Not all test results are available during your visit. If your test results are not back during the visit, make an appointment with your caregiver to find out the results. Do not assume everything is normal if  you have not heard from your caregiver or the medical facility. It is important for you to follow up on all of your test results.  SEEK IMMEDIATE MEDICAL ATTENTION IF:  You have more than a spotting of blood in your stool.   Your belly is swollen (abdominal distention).   You are nauseated or vomiting.   You have a temperature over 101.   You have abdominal pain or discomfort that is severe or gets worse throughout the day.   Your colonoscopy revealed 2 polyp(s) which I removed successfully. Await pathology results, my office will contact you. I recommend repeating colonoscopy in 5 years for surveillance purposes.   You also have internal hemorrhoids which is likely causing your rectal bleeding. Follow up with Roseanne Kaufman in 2-3 months to discuss banding.    I hope you have a great rest of your week!  Elon Alas. Abbey Chatters, D.O. Gastroenterology and Hepatology [facility]

## 2020-11-12 DIAGNOSIS — M5136 Other intervertebral disc degeneration, lumbar region: Secondary | ICD-10-CM | POA: Diagnosis not present

## 2020-11-12 LAB — SURGICAL PATHOLOGY

## 2020-11-14 ENCOUNTER — Encounter (HOSPITAL_COMMUNITY): Payer: Self-pay | Admitting: Internal Medicine

## 2020-11-14 NOTE — Progress Notes (Signed)
On recall  °

## 2020-11-25 DIAGNOSIS — M5416 Radiculopathy, lumbar region: Secondary | ICD-10-CM | POA: Diagnosis not present

## 2020-11-26 DIAGNOSIS — I129 Hypertensive chronic kidney disease with stage 1 through stage 4 chronic kidney disease, or unspecified chronic kidney disease: Secondary | ICD-10-CM | POA: Diagnosis not present

## 2020-11-26 DIAGNOSIS — N184 Chronic kidney disease, stage 4 (severe): Secondary | ICD-10-CM | POA: Diagnosis not present

## 2020-11-26 DIAGNOSIS — E1122 Type 2 diabetes mellitus with diabetic chronic kidney disease: Secondary | ICD-10-CM | POA: Diagnosis not present

## 2020-11-26 DIAGNOSIS — J449 Chronic obstructive pulmonary disease, unspecified: Secondary | ICD-10-CM | POA: Diagnosis not present

## 2020-11-26 DIAGNOSIS — D631 Anemia in chronic kidney disease: Secondary | ICD-10-CM | POA: Diagnosis not present

## 2020-12-03 NOTE — Progress Notes (Signed)
Cardiology Office Note:   Date:  12/04/2020  NAME:  Rebekah Ray    MRN: [id number] DOB:  12-30-1955   PCP:  Iona Beard, MD  Cardiologist:  No primary care provider on file.   Referring MD: Corliss Parish, MD   Chief Complaint  Patient presents with  . Congestive Heart Failure   History of Present Illness:   Rebekah Ray is a 65 y.o. female with a hx of HTN, CKD 3, HFpEF, diabetes, hyperlipidemia who is being seen today for the evaluation of HFpEF at the request of Iona Beard, MD.  She reports she can no longer see her preoperatively for some insurance changes.  She will move forward with [place].  History of diastolic heart failure.  Appears to be well controlled.  He takes Lasix 20 mg as needed.  She is euvolemic on examination today.  She reports she works at [employer].  She works 8-hour shift.  No significant shortness of breath or chest pain reported.  She takes an aspirin daily.  Most recent LDL cholesterol not at goal.  She is on fenofibrate as well.  Blood pressure 132/80.  EKG normal.  She is a former smoker.  She smoked 40 years.  She quit several years ago.  Stress test in 2016 normal.  Echocardiogram 2018 unremarkable.  She does have diabetes with an A1c of 8.9.  She is working on this.  Cholesterol could be a bit lower.  All other things appear to be stable.  She reports she is trying to lose weight.  She denies chest pain, shortness of breath or palpitations in office today.  Problem List 1. HFpEF -EF 55-60% 2019 [facility]) -NM Stress negative 2016 [facility]) 2. CKD 3 -eGFR 35 3. HTN 4. Aortic valve sclerosis  5. Former Tobacco Abuse  6.  Hyperlipidemia -Total cholesterol 141, HDL 39, LDL 74, triglycerides 180 7.  Diabetes -A1c 8.9  Past Medical History: Past Medical History:  Diagnosis Date  . COPD (chronic obstructive pulmonary disease) ([place])   . Diabetes mellitus without complication ([place])   . Hyperlipidemia   . Hypertension   . Renal disorder   . Thyroid  disease     Past Surgical History: Past Surgical History:  Procedure Laterality Date  . BREAST BIOPSY Right   . CESAREAN SECTION    . COLONOSCOPY WITH PROPOFOL N/A 11/11/2020   Procedure: COLONOSCOPY WITH PROPOFOL;  Surgeon: Eloise Harman, DO;  Location: [facility];  Service: Endoscopy;  Laterality: N/A;  2:30pm, pt knows to arrive at 9:00  . FOOT SURGERY    . POLYPECTOMY  11/11/2020   Procedure: POLYPECTOMY;  Surgeon: Eloise Harman, DO;  Location: [facility];  Service: Endoscopy;;  . TUBAL LIGATION      Current Medications: Current Meds  Medication Sig  . acetaminophen (TYLENOL) 500 MG tablet Take 500 mg by mouth every 6 (six) hours as needed for mild pain.  Marland Kitchen albuterol (PROVENTIL HFA;VENTOLIN HFA) 108 (90 BASE) MCG/ACT inhaler Inhale 2 puffs into the lungs every 6 (six) hours as needed for wheezing or shortness of breath.  Marland Kitchen albuterol (PROVENTIL) (2.5 MG/3ML) 0.083% nebulizer solution Take 2.5 mg by nebulization every 6 (six) hours as needed for wheezing or shortness of breath.  Marland Kitchen aspirin EC 81 MG tablet Take 81 mg by mouth daily.  . carvedilol (COREG) 25 MG tablet Take 25 mg by mouth 2 (two) times daily with a meal.   . fenofibrate (TRICOR) 48 MG tablet Take 48 mg  by mouth daily.  . ferrous sulfate 325 (65 FE) MG tablet Take 162.5 mg by mouth 2 (two) times a week.  . furosemide (LASIX) 20 MG tablet Take 1 tablet (20 mg total) by mouth daily as needed for fluid. (Patient taking differently: Take 20-40 mg by mouth See admin instructions. Takes 1 tablet daily and extra tablet if needed)  . gabapentin (NEURONTIN) 100 MG capsule Take 200 mg by mouth 2 (two) times daily.  . hydrALAZINE (APRESOLINE) 50 MG tablet Take 1 tablet (50 mg total) by mouth every 8 (eight) hours.  . insulin glargine (LANTUS) 100 UNIT/ML injection Inject 20-48 Units into the skin in the morning. Self adjusts  . levothyroxine (SYNTHROID, LEVOTHROID) 50 MCG tablet Take 50 mcg by mouth daily before breakfast.   . methocarbamol (ROBAXIN) 500 MG tablet Take 500 mg by mouth every 8 (eight) hours as needed for muscle spasms.  . Multiple Vitamins-Minerals (MULTIVITAMIN WITH MINERALS) tablet Take 1 tablet by mouth daily.  . sitaGLIPtin (JANUVIA) 100 MG tablet Take 50 mg by mouth daily.  . [DISCONTINUED] atorvastatin (LIPITOR) 20 MG tablet Take 20 mg by mouth at bedtime.     Allergies:    Norvasc [amlodipine besylate]   Social History: Social History   Socioeconomic History  . Marital status: Divorced    Spouse name: Not on file  . Number of children: 2  . Years of education: Not on file  . Highest education level: Not on file  Occupational History  . Occupation: Museum/gallery curator  Tobacco Use  . Smoking status: Former Smoker    Packs/day: 2.00    Years: 42.00    Pack years: 84.00    Types: Cigarettes    Quit date: 01/22/2014    Years since quitting: 6.8  . Smokeless tobacco: Former Network engineer and Sexual Activity  . Alcohol use: No  . Drug use: No  . Sexual activity: Never  Other Topics Concern  . Not on file  Social History Narrative  . Not on file   Social Determinants of Health   Financial Resource Strain: Not on file  Food Insecurity: Not on file  Transportation Needs: Not on file  Physical Activity: Not on file  Stress: Not on file  Social Connections: Not on file     Family History: The patient's family history includes Diabetes in her father; Heart Problems in her mother; Heart attack in her father and mother; Kidney disease in her father and mother; Ovarian cancer in her mother; Stroke in her brother.  ROS:   All other ROS reviewed and negative. Pertinent positives noted in the HPI.     EKGs/Labs/Other Studies Reviewed:   The following studies were personally reviewed by me today:  EKG:  EKG is ordered today.  The ekg ordered today demonstrates normal sinus rhythm heart rate 62, no acute ischemic changes, no evidence of infarction, and was personally reviewed by  me.   TTE [facility] 10/17/2018  SUMMARY  The left ventricular size is normal. There is normal left ventricular wall  thickness. LV ejection fraction = 55-60%. Left ventricular systolic function  is normal.  The right ventricle is normal in size and function.  The left atrial size is normal.   Right atrial size is normal.  There is no significant valvular stenosis or regurgitation.  There was insufficient TR detected to calculate RV systolic pressure.  The aortic root is normal size.  The IVC is normal in size with an inspiratory collapse of greater then  50%,  suggesting normal right atrial pressure.  There is no pericardial effusion.  No significant change in LVEF in comparison with the prior study report.  -   NM Stress 03/18/2015 Show Low  1. No inducible ischemia.  2. Normal left ventricular ejection fraction.   Recent Labs: 11/06/2020: BUN 34; Creatinine, Ser 1.61; Hemoglobin 11.1; Platelets 287; Potassium 3.8; Sodium 137   Recent Lipid Panel No results found for: CHOL, TRIG, HDL, CHOLHDL, VLDL, LDLCALC, LDLDIRECT  Physical Exam:   VS:  BP 132/80   Pulse 62   Ht '4\' 11"'  (1.499 m)   Wt 186 lb (84.4 kg)   SpO2 97%   BMI 37.57 kg/m    Wt Readings from Last 3 Encounters:  12/04/20 186 lb (84.4 kg)  11/06/20 189 lb (85.7 kg)  10/22/20 190 lb (86.2 kg)    General: Well nourished, well developed, in no acute distress Head: Atraumatic, normal size  Eyes: PEERLA, EOMI  Neck: Supple, no JVD Endocrine: No thryomegaly Cardiac: Normal S1, S2; RRR; faint 1 out of 6 systolic ejection murmur Lungs: Clear to auscultation bilaterally, no wheezing, rhonchi or rales  Abd: Soft, nontender, no hepatomegaly  Ext: No edema, pulses 2+ Musculoskeletal: No deformities, BUE and BLE strength normal and equal Skin: Warm and dry, no rashes   Neuro: Alert and oriented to person, place, time, and situation, CNII-XII grossly intact, no focal deficits  Psych: Normal mood and affect    ASSESSMENT:   Rebekah Ray is a 65 y.o. female who presents for the following: 1. Chronic diastolic heart failure ([place])   2. Primary hypertension   3. Mixed hyperlipidemia     PLAN:   1. Chronic diastolic heart failure (HCC) -History of diastolic heart failure.  Follow[facility] in the past.  Stress test in 2016 normal.  Echocardiogram 2019 with EF 55 to 60% with grade 1 diastolic dysfunction. -Blood pressure well controlled.  No change in medications. -Euvolemic on examination.  She will take Lasix 20 mg daily. -Proper diet and exercise encouraged today. -Overall seems to be doing well.  2. Primary hypertension -Continue home blood pressure medications.  No changes.  3. Mixed hyperlipidemia -Most recent LDL cholesterol 74.  Triglycerides 180.  She will continue fenofibrate.  Increase Lipitor to 40 mg a day.  Goal LDL cholesterol less than 70 and triglycerides less than 150.  Disposition: Return in about 1 year (around 12/04/2021).  Medication Adjustments/Labs and Tests Ordered: Current medicines are reviewed at length with the patient today.  Concerns regarding medicines are outlined above.  Orders Placed This Encounter  Procedures  . EKG 12-Lead   Meds ordered this encounter  Medications  . atorvastatin (LIPITOR) 40 MG tablet    Sig: Take 1 tablet (40 mg total) by mouth at bedtime.    Dispense:  90 tablet    Refill:  1    Patient Instructions  Medication Instructions:  Increase Lipitor to 40 mg daily   *If you need a refill on your cardiac medications before your next appointment, please call your pharmacy*  Follow-Up: At [facility], you and your health needs are our priority.  As part of our continuing mission to provide you with exceptional heart care, we have created designated Provider Care Teams.  These Care Teams include your primary Cardiologist (physician) and Advanced Practice Providers (APPs -  Physician Assistants and Nurse Practitioners) who  all work together to provide you with the care you need, when you need it.  We  recommend signing up for the patient portal called "MyChart".  Sign up information is provided on this After Visit Summary.  MyChart is used to connect with patients for Virtual Visits (Telemedicine).  Patients are able to view lab/test results, encounter notes, upcoming appointments, etc.  Non-urgent messages can be sent to your provider as well.   To learn more about what you can do with MyChart, go to [email or web address].    Your next appointment:   12 month(s)  The format for your next appointment:   In Person  Provider:   Eleonore Chiquito, MD       Signed, Addison Naegeli. Audie Box, [place]  [address], [place], [place] [address] [phone]  12/04/2020 4:28 PM

## 2020-12-04 ENCOUNTER — Other Ambulatory Visit: Payer: Self-pay

## 2020-12-04 ENCOUNTER — Encounter: Payer: Self-pay | Admitting: Cardiovascular Disease

## 2020-12-04 ENCOUNTER — Ambulatory Visit: Payer: Medicare Other | Admitting: Cardiovascular Disease

## 2020-12-04 VITALS — BP 132/80 | HR 62 | Ht 59.0 in | Wt 186.0 lb

## 2020-12-04 DIAGNOSIS — E782 Mixed hyperlipidemia: Secondary | ICD-10-CM | POA: Diagnosis not present

## 2020-12-04 DIAGNOSIS — I10 Essential (primary) hypertension: Secondary | ICD-10-CM | POA: Diagnosis not present

## 2020-12-04 DIAGNOSIS — I5032 Chronic diastolic (congestive) heart failure: Secondary | ICD-10-CM | POA: Diagnosis not present

## 2020-12-04 MED ORDER — ATORVASTATIN CALCIUM 40 MG PO TABS: 40.0000 mg | ORAL_TABLET | Freq: Every day | ORAL | 1 refills | Status: DC

## 2020-12-04 NOTE — Patient Instructions (Signed)
Medication Instructions:  Increase Lipitor to 40 mg daily   *If you need a refill on your cardiac medications before your next appointment, please call your pharmacy*   Follow-Up: At [facility], you and your health needs are our priority.  As part of our continuing mission to provide you with exceptional heart care, we have created designated Provider Care Teams.  These Care Teams include your primary Cardiologist (physician) and Advanced Practice Providers (APPs -  Physician Assistants and Nurse Practitioners) who all work together to provide you with the care you need, when you need it.  We recommend signing up for the patient portal called "MyChart".  Sign up information is provided on this After Visit Summary.  MyChart is used to connect with patients for Virtual Visits (Telemedicine).  Patients are able to view lab/test results, encounter notes, upcoming appointments, etc.  Non-urgent messages can be sent to your provider as well.   To learn more about what you can do with MyChart, go to [email or web address].    Your next appointment:   12 month(s)  The format for your next appointment:   In Person  Provider:   Amherst O'Neal, MD   

## 2020-12-10 DIAGNOSIS — M5416 Radiculopathy, lumbar region: Secondary | ICD-10-CM | POA: Diagnosis not present

## 2020-12-22 DIAGNOSIS — N183 Chronic kidney disease, stage 3 unspecified: Secondary | ICD-10-CM | POA: Diagnosis not present

## 2020-12-22 DIAGNOSIS — K648 Other hemorrhoids: Secondary | ICD-10-CM | POA: Diagnosis not present

## 2020-12-22 DIAGNOSIS — E039 Hypothyroidism, unspecified: Secondary | ICD-10-CM | POA: Diagnosis not present

## 2020-12-22 DIAGNOSIS — I10 Essential (primary) hypertension: Secondary | ICD-10-CM | POA: Diagnosis not present

## 2020-12-22 DIAGNOSIS — E1122 Type 2 diabetes mellitus with diabetic chronic kidney disease: Secondary | ICD-10-CM | POA: Diagnosis not present

## 2020-12-22 DIAGNOSIS — J449 Chronic obstructive pulmonary disease, unspecified: Secondary | ICD-10-CM | POA: Diagnosis not present

## 2020-12-22 DIAGNOSIS — I5032 Chronic diastolic (congestive) heart failure: Secondary | ICD-10-CM | POA: Diagnosis not present

## 2020-12-22 DIAGNOSIS — Z7189 Other specified counseling: Secondary | ICD-10-CM | POA: Diagnosis not present

## 2020-12-29 DIAGNOSIS — M5416 Radiculopathy, lumbar region: Secondary | ICD-10-CM | POA: Diagnosis not present

## 2021-01-09 ENCOUNTER — Ambulatory Visit: Payer: Medicare Other | Admitting: Gastroenterology

## 2021-01-09 ENCOUNTER — Other Ambulatory Visit: Payer: Self-pay

## 2021-01-09 ENCOUNTER — Encounter: Payer: Self-pay | Admitting: Gastroenterology

## 2021-01-09 DIAGNOSIS — K641 Second degree hemorrhoids: Secondary | ICD-10-CM | POA: Diagnosis not present

## 2021-01-09 DIAGNOSIS — K649 Unspecified hemorrhoids: Secondary | ICD-10-CM | POA: Insufficient documentation

## 2021-01-09 NOTE — Progress Notes (Signed)
  [facility] Banding Note:   Pleasant 65 year old female presenting today for symptomatic hemorrhoids, colonoscopy recently completed Jan 2022 with non-bleeding internal hemorrhoids, few small-mouthed diverticula, 2 mm polyp in ascending, 4 mm polyp in transverse. Tubular adenoma. Surveillance 5 years. Symptoms including fecal seepage/bleeding, itching. No pain or prolapsing.     The patient presents with symptomatic grade 1 hemorrhoids, unresponsive to maximal medical therapy, requesting rubber band ligation of her hemorrhoidal disease. All risks, benefits, and alternative forms of therapy were described and informed consent was obtained.  The decision was made to band the left lateral internal hemorrhoid, and the [place] was used to perform band ligation without complication. Digital anorectal examination was then performed to assure proper positioning of the band, and to adjust the banded tissue as required. The patient was discharged home without pain or other issues. Dietary and behavioral recommendations were given and (if necessary prescriptions were given), along with follow-up instructions. The patient will return in 2-3 weeks for followup and possible additional banding as required.  No complications were encountered and the patient tolerated the procedure well.   Rebekah Needs, PhD, ANP-BC [facility] Gastroenterology

## 2021-01-09 NOTE — Patient Instructions (Signed)
Continue to avoid straining, limit toilet time as you are doing.  We will see you back in a few weeks for repeat banding!   It was a pleasure to see you today. I want to create trusting relationships with patients to provide genuine, compassionate, and quality care. I value your feedback. If you receive a survey regarding your visit,  I greatly appreciate you taking time to fill this out.   Annitta Needs, PhD, ANP-BC [facility] Gastroenterology

## 2021-01-14 DIAGNOSIS — M5416 Radiculopathy, lumbar region: Secondary | ICD-10-CM | POA: Diagnosis not present

## 2021-01-27 DIAGNOSIS — M5136 Other intervertebral disc degeneration, lumbar region: Secondary | ICD-10-CM | POA: Diagnosis not present

## 2021-01-27 DIAGNOSIS — M47816 Spondylosis without myelopathy or radiculopathy, lumbar region: Secondary | ICD-10-CM | POA: Diagnosis not present

## 2021-02-03 ENCOUNTER — Encounter: Payer: Medicare Other | Admitting: Gastroenterology

## 2021-02-05 DIAGNOSIS — I130 Hypertensive heart and chronic kidney disease with heart failure and stage 1 through stage 4 chronic kidney disease, or unspecified chronic kidney disease: Secondary | ICD-10-CM | POA: Diagnosis not present

## 2021-02-05 DIAGNOSIS — I5032 Chronic diastolic (congestive) heart failure: Secondary | ICD-10-CM | POA: Diagnosis not present

## 2021-02-05 DIAGNOSIS — N183 Chronic kidney disease, stage 3 unspecified: Secondary | ICD-10-CM | POA: Diagnosis not present

## 2021-02-05 DIAGNOSIS — E1122 Type 2 diabetes mellitus with diabetic chronic kidney disease: Secondary | ICD-10-CM | POA: Diagnosis not present

## 2021-02-19 DIAGNOSIS — M5136 Other intervertebral disc degeneration, lumbar region: Secondary | ICD-10-CM | POA: Diagnosis not present

## 2021-02-24 ENCOUNTER — Encounter: Payer: Self-pay | Admitting: Internal Medicine

## 2021-02-24 ENCOUNTER — Encounter: Payer: Medicare Other | Admitting: Gastroenterology

## 2021-02-24 NOTE — Progress Notes (Deleted)
Pleasant 65 year old female presenting today for symptomatic hemorrhoids, colonoscopy recently completed Jan 2022 with non-bleeding internal hemorrhoids, few small-mouthed diverticula, 2 mm polyp in ascending, 4 mm polyp in transverse. Tubular adenoma. Surveillance 5 years. Symptoms including fecal seepage/bleeding, itching. First banding completed in March 2022 of left lateral.

## 2021-03-02 DIAGNOSIS — E1122 Type 2 diabetes mellitus with diabetic chronic kidney disease: Secondary | ICD-10-CM | POA: Diagnosis not present

## 2021-03-02 DIAGNOSIS — M545 Low back pain, unspecified: Secondary | ICD-10-CM | POA: Diagnosis not present

## 2021-03-02 DIAGNOSIS — N182 Chronic kidney disease, stage 2 (mild): Secondary | ICD-10-CM | POA: Diagnosis not present

## 2021-03-02 DIAGNOSIS — E039 Hypothyroidism, unspecified: Secondary | ICD-10-CM | POA: Diagnosis not present

## 2021-03-02 DIAGNOSIS — J449 Chronic obstructive pulmonary disease, unspecified: Secondary | ICD-10-CM | POA: Diagnosis not present

## 2021-03-02 DIAGNOSIS — N1832 Chronic kidney disease, stage 3b: Secondary | ICD-10-CM | POA: Diagnosis not present

## 2021-03-02 DIAGNOSIS — I5032 Chronic diastolic (congestive) heart failure: Secondary | ICD-10-CM | POA: Diagnosis not present

## 2021-03-03 DIAGNOSIS — M5416 Radiculopathy, lumbar region: Secondary | ICD-10-CM | POA: Diagnosis not present

## 2021-03-03 DIAGNOSIS — M47816 Spondylosis without myelopathy or radiculopathy, lumbar region: Secondary | ICD-10-CM | POA: Diagnosis not present

## 2021-03-07 DIAGNOSIS — E1122 Type 2 diabetes mellitus with diabetic chronic kidney disease: Secondary | ICD-10-CM | POA: Diagnosis not present

## 2021-03-07 DIAGNOSIS — N183 Chronic kidney disease, stage 3 unspecified: Secondary | ICD-10-CM | POA: Diagnosis not present

## 2021-03-07 DIAGNOSIS — I130 Hypertensive heart and chronic kidney disease with heart failure and stage 1 through stage 4 chronic kidney disease, or unspecified chronic kidney disease: Secondary | ICD-10-CM | POA: Diagnosis not present

## 2021-03-07 DIAGNOSIS — I5032 Chronic diastolic (congestive) heart failure: Secondary | ICD-10-CM | POA: Diagnosis not present

## 2021-03-19 DIAGNOSIS — M25551 Pain in right hip: Secondary | ICD-10-CM | POA: Diagnosis not present

## 2021-04-07 DIAGNOSIS — E1122 Type 2 diabetes mellitus with diabetic chronic kidney disease: Secondary | ICD-10-CM | POA: Diagnosis not present

## 2021-04-07 DIAGNOSIS — I5032 Chronic diastolic (congestive) heart failure: Secondary | ICD-10-CM | POA: Diagnosis not present

## 2021-04-07 DIAGNOSIS — I130 Hypertensive heart and chronic kidney disease with heart failure and stage 1 through stage 4 chronic kidney disease, or unspecified chronic kidney disease: Secondary | ICD-10-CM | POA: Diagnosis not present

## 2021-04-07 DIAGNOSIS — N183 Chronic kidney disease, stage 3 unspecified: Secondary | ICD-10-CM | POA: Diagnosis not present

## 2021-04-09 DIAGNOSIS — M25551 Pain in right hip: Secondary | ICD-10-CM | POA: Diagnosis not present

## 2021-05-07 DIAGNOSIS — I130 Hypertensive heart and chronic kidney disease with heart failure and stage 1 through stage 4 chronic kidney disease, or unspecified chronic kidney disease: Secondary | ICD-10-CM | POA: Diagnosis not present

## 2021-05-07 DIAGNOSIS — N183 Chronic kidney disease, stage 3 unspecified: Secondary | ICD-10-CM | POA: Diagnosis not present

## 2021-05-07 DIAGNOSIS — E1122 Type 2 diabetes mellitus with diabetic chronic kidney disease: Secondary | ICD-10-CM | POA: Diagnosis not present

## 2021-05-07 DIAGNOSIS — I5032 Chronic diastolic (congestive) heart failure: Secondary | ICD-10-CM | POA: Diagnosis not present

## 2021-05-19 ENCOUNTER — Other Ambulatory Visit: Payer: Self-pay

## 2021-05-19 ENCOUNTER — Encounter: Payer: Self-pay | Admitting: Gastroenterology

## 2021-05-19 ENCOUNTER — Ambulatory Visit: Payer: Medicare Other | Admitting: Gastroenterology

## 2021-05-19 VITALS — BP 174/79 | HR 70 | Temp 97.7°F | Ht 58.5 in | Wt 193.4 lb

## 2021-05-19 DIAGNOSIS — K640 First degree hemorrhoids: Secondary | ICD-10-CM | POA: Diagnosis not present

## 2021-05-19 NOTE — Patient Instructions (Signed)
Continue to avoid straining, constipation, and prolonged toilet time.  We will see you back for final banding! However, if you are doing really well, we can monitor and have you come back as needed.  I enjoyed seeing you again today! As you know, I value our relationship and want to provide genuine, compassionate, and quality care. I welcome your feedback. If you receive a survey regarding your visit,  I greatly appreciate you taking time to fill this out. See you next time!  Annitta Needs, PhD, ANP-BC [facility] Gastroenterology

## 2021-05-19 NOTE — Progress Notes (Signed)
[facility] Banding Note:   Very pleasant 65 year old female presenting today for symptomatic hemorrhoids, colonoscopy recently completed Jan 2022 with non-bleeding internal hemorrhoids, few small-mouthed diverticula, 2 mm polyp in ascending, 4 mm polyp in transverse. Tubular adenoma. Surveillance 5 years. Underwent left lateral column banding in March 2022. She notes improvement in rectal bleeding overall.   The patient presents with symptomatic grade 1-2 hemorrhoids, unresponsive to maximal medical therapy, requesting rubber band ligation of her hemorrhoidal disease. All risks, benefits, and alternative forms of therapy were described and informed consent was obtained.  The decision was made to band the right posterior internal hemorrhoid, but appeared to not be sufficient tissue. I then turned attention towards the right anterior column, and the [place] was used to perform band ligation without complication. Digital anorectal examination was then performed to assure proper positioning of the band, and to adjust the banded tissue as required. The patient was discharged home without pain or other issues. Dietary and behavioral recommendations were given and (if necessary prescriptions were given), along with follow-up instructions. The patient will return in several weeks for follow-up if felt she needs additional banding. She may not have sufficient tissue in right posterior column, but we could certainly pursue this if persistent symptoms or perform neutral banding.   Annitta Needs, PhD, ANP-BC [facility] Gastroenterology

## 2021-05-25 DIAGNOSIS — E039 Hypothyroidism, unspecified: Secondary | ICD-10-CM | POA: Diagnosis not present

## 2021-05-25 DIAGNOSIS — E1122 Type 2 diabetes mellitus with diabetic chronic kidney disease: Secondary | ICD-10-CM | POA: Diagnosis not present

## 2021-05-25 DIAGNOSIS — E1165 Type 2 diabetes mellitus with hyperglycemia: Secondary | ICD-10-CM | POA: Diagnosis not present

## 2021-05-25 DIAGNOSIS — N1832 Chronic kidney disease, stage 3b: Secondary | ICD-10-CM | POA: Diagnosis not present

## 2021-05-25 DIAGNOSIS — I10 Essential (primary) hypertension: Secondary | ICD-10-CM | POA: Diagnosis not present

## 2021-05-25 DIAGNOSIS — I5032 Chronic diastolic (congestive) heart failure: Secondary | ICD-10-CM | POA: Diagnosis not present

## 2021-05-25 DIAGNOSIS — J449 Chronic obstructive pulmonary disease, unspecified: Secondary | ICD-10-CM | POA: Diagnosis not present

## 2021-06-07 DIAGNOSIS — I5032 Chronic diastolic (congestive) heart failure: Secondary | ICD-10-CM | POA: Diagnosis not present

## 2021-06-07 DIAGNOSIS — I130 Hypertensive heart and chronic kidney disease with heart failure and stage 1 through stage 4 chronic kidney disease, or unspecified chronic kidney disease: Secondary | ICD-10-CM | POA: Diagnosis not present

## 2021-06-07 DIAGNOSIS — E1122 Type 2 diabetes mellitus with diabetic chronic kidney disease: Secondary | ICD-10-CM | POA: Diagnosis not present

## 2021-06-07 DIAGNOSIS — N183 Chronic kidney disease, stage 3 unspecified: Secondary | ICD-10-CM | POA: Diagnosis not present

## 2021-06-09 ENCOUNTER — Other Ambulatory Visit: Payer: Self-pay | Admitting: Cardiovascular Disease

## 2021-06-10 ENCOUNTER — Other Ambulatory Visit: Payer: Self-pay | Admitting: Physical Medicine & Rehabilitation

## 2021-06-10 DIAGNOSIS — M25551 Pain in right hip: Secondary | ICD-10-CM | POA: Diagnosis not present

## 2021-06-10 DIAGNOSIS — R292 Abnormal reflex: Secondary | ICD-10-CM

## 2021-06-10 DIAGNOSIS — R29898 Other symptoms and signs involving the musculoskeletal system: Secondary | ICD-10-CM

## 2021-06-11 ENCOUNTER — Other Ambulatory Visit: Payer: Self-pay

## 2021-06-11 ENCOUNTER — Ambulatory Visit
Admission: RE | Admit: 2021-06-11 | Discharge: 2021-06-11 | Disposition: A | Discharge status: Home / Self Care | Payer: Medicare Other | Source: Ambulatory Visit | Attending: Physical Medicine & Rehabilitation | Admitting: Physical Medicine & Rehabilitation

## 2021-06-11 DIAGNOSIS — R29898 Other symptoms and signs involving the musculoskeletal system: Secondary | ICD-10-CM

## 2021-06-11 DIAGNOSIS — M48061 Spinal stenosis, lumbar region without neurogenic claudication: Secondary | ICD-10-CM | POA: Diagnosis not present

## 2021-06-11 DIAGNOSIS — R292 Abnormal reflex: Secondary | ICD-10-CM

## 2021-06-11 DIAGNOSIS — M545 Low back pain, unspecified: Secondary | ICD-10-CM | POA: Diagnosis not present

## 2021-06-20 ENCOUNTER — Emergency Department (HOSPITAL_COMMUNITY)
Admission: EM | Admit: 2021-06-20 | Discharge: 2021-06-20 | Disposition: A | Discharge status: Home / Self Care | Payer: Medicare Other | Attending: Emergency Medicine | Admitting: Emergency Medicine

## 2021-06-20 ENCOUNTER — Encounter (HOSPITAL_COMMUNITY): Payer: Self-pay | Admitting: Emergency Medicine

## 2021-06-20 ENCOUNTER — Other Ambulatory Visit: Payer: Self-pay

## 2021-06-20 DIAGNOSIS — M47896 Other spondylosis, lumbar region: Secondary | ICD-10-CM | POA: Diagnosis not present

## 2021-06-20 DIAGNOSIS — Z7982 Long term (current) use of aspirin: Secondary | ICD-10-CM | POA: Insufficient documentation

## 2021-06-20 DIAGNOSIS — J449 Chronic obstructive pulmonary disease, unspecified: Secondary | ICD-10-CM | POA: Insufficient documentation

## 2021-06-20 DIAGNOSIS — M5416 Radiculopathy, lumbar region: Secondary | ICD-10-CM | POA: Diagnosis not present

## 2021-06-20 DIAGNOSIS — M4726 Other spondylosis with radiculopathy, lumbar region: Secondary | ICD-10-CM

## 2021-06-20 DIAGNOSIS — Z794 Long term (current) use of insulin: Secondary | ICD-10-CM | POA: Insufficient documentation

## 2021-06-20 DIAGNOSIS — I509 Heart failure, unspecified: Secondary | ICD-10-CM | POA: Diagnosis not present

## 2021-06-20 DIAGNOSIS — Z79899 Other long term (current) drug therapy: Secondary | ICD-10-CM | POA: Insufficient documentation

## 2021-06-20 DIAGNOSIS — Z87891 Personal history of nicotine dependence: Secondary | ICD-10-CM | POA: Insufficient documentation

## 2021-06-20 DIAGNOSIS — E1169 Type 2 diabetes mellitus with other specified complication: Secondary | ICD-10-CM | POA: Insufficient documentation

## 2021-06-20 DIAGNOSIS — E785 Hyperlipidemia, unspecified: Secondary | ICD-10-CM | POA: Insufficient documentation

## 2021-06-20 DIAGNOSIS — M545 Low back pain, unspecified: Secondary | ICD-10-CM | POA: Diagnosis present

## 2021-06-20 DIAGNOSIS — I110 Hypertensive heart disease with heart failure: Secondary | ICD-10-CM | POA: Diagnosis not present

## 2021-06-20 MED ORDER — TRAMADOL HCL 50 MG PO TABS: 50.0000 mg | ORAL_TABLET | Freq: Four times a day (QID) | ORAL | 0 refills | Status: DC | PRN

## 2021-06-20 NOTE — Discharge Instructions (Addendum)
The pain in your back and right leg appears to be from a pinched nerve in the lower back.  Use heat on the sore area 3-4 times a day.  Avoid ambulating.  We are giving a short-term prescription for a narcotic pain reliever.  Do not drive, work or drink alcohol when taking the narcotic pain reliever.  Call your doctor on Monday for a follow-up appointment as soon as possible.

## 2021-06-20 NOTE — ED Triage Notes (Signed)
History of back pain.  Here today for pain to right hip, right leg, and back.  Rates pain 5/10.  Took tylenol 1000 mg at 0400 this am and Gabapentin 200  mg.

## 2021-06-20 NOTE — ED Provider Notes (Signed)
[facility] EMERGENCY DEPARTMENT Provider Note   CSN: [id number] Arrival date & time: 06/20/21  0745     History Chief Complaint  Patient presents with   Leg Pain    Right leg    Rebekah Ray is a 65 y.o. female.  HPI She presents for evaluation of low back pain, right-sided, radiating to right leg and right hip.  No known trauma.  She has been evaluated and treated by her, physician, Dr. Thedore Mins who is a physiatrist, and prescribed gabapentin.  She is unable to take prednisone because of diabetes.  She states she cannot take ibuprofen, for an unknown reason.  She had an MRI done on 06/11/2021 which showed degenerative changes and neural foramen stenosis at L5-S1.  She denies loss of bowel or bladder function.  Today, she went to work but after our was unable to perform her activities.  She returned to work, several days ago after being off for short period of time at the instructions of her physiatrist.  There are no other known active modifying factors.    Past Medical History:  Diagnosis Date   COPD (chronic obstructive pulmonary disease) ([place])    Diabetes mellitus without complication ([employer])    Hyperlipidemia    Hypertension    Renal disorder    Thyroid disease     Patient Active Problem List   Diagnosis Date Noted   Grade I hemorrhoids 05/19/2021   Hemorrhoids 01/09/2021   Morbid obesity due to excess calories ([place]) 03/15/2020   COPD  GOLD 0 / Mild AB better since quit smoking 01/18/2014   Acute kidney injury ([place]) 01/16/2014   HTN (hypertension) 01/15/2014   Noncompliance 01/15/2014   Tobacco abuse 01/15/2014   CHF (congestive heart failure) ([place]) 01/14/2014   Acute bronchitis 01/14/2014   DM (diabetes mellitus) ([place]) 01/14/2014   Hypertensive urgency 01/14/2014   Low back pain 08/06/2011    Past Surgical History:  Procedure Laterality Date   BREAST BIOPSY Right    CESAREAN SECTION     COLONOSCOPY WITH PROPOFOL N/A 11/11/2020   non-bleeding internal  hemorrhoids, few small-mouthed diverticula, 2 mm polyp in ascending, 4 mm polyp in transverse. Tubular adenoma. Surveillance 5 years.    FOOT SURGERY     POLYPECTOMY  11/11/2020   Procedure: POLYPECTOMY;  Surgeon: Eloise Harman, DO;  Location: [facility];  Service: Endoscopy;;   TUBAL LIGATION       OB History   No obstetric history on file.     Family History  Problem Relation Age of Onset   Ovarian cancer Mother    Kidney disease Mother    Heart Problems Mother    Heart attack Mother    Kidney disease Father    Diabetes Father    Heart attack Father    Stroke Brother     Social History   Tobacco Use   Smoking status: Former    Packs/day: 2.00    Years: 42.00    Pack years: 84.00    Types: Cigarettes    Quit date: 01/22/2014    Years since quitting: 7.4   Smokeless tobacco: Former  Substance Use Topics   Alcohol use: No   Drug use: No    Home Medications Prior to Admission medications   Medication Sig Start Date End Date Taking? Authorizing Provider  traMADol (ULTRAM) 50 MG tablet Take 1 tablet (50 mg total) by mouth every 6 (six) hours as needed. 06/20/21  Yes Daleen Bo, MD  acetaminophen (  TYLENOL) 500 MG tablet Take 500 mg by mouth as needed for mild pain.    [provider]  albuterol (PROVENTIL HFA;VENTOLIN HFA) 108 (90 BASE) MCG/ACT inhaler Inhale 2 puffs into the lungs every 6 (six) hours as needed for wheezing or shortness of breath. Patient taking differently: Inhale 2 puffs into the lungs as needed for wheezing or shortness of breath. 01/18/14   Dhungel, Flonnie Overman, MD  albuterol (PROVENTIL) (2.5 MG/3ML) 0.083% nebulizer solution Take 2.5 mg by nebulization as needed for wheezing or shortness of breath.    [provider]  aspirin EC 81 MG tablet Take 81 mg by mouth daily.    [provider]  atorvastatin (LIPITOR) 40 MG tablet TAKE 1 TABLET(40 MG) BY MOUTH AT BEDTIME 06/09/21   O'Neal, Cassie Freer, MD  carvedilol (COREG) 25  MG tablet Take 25 mg by mouth 2 (two) times daily with a meal.     [provider]  fenofibrate (TRICOR) 48 MG tablet Take 48 mg by mouth daily. 09/25/18   [provider]  ferrous sulfate 325 (65 FE) MG tablet Take 162.5 mg by mouth 2 (two) times a week.    [provider]  furosemide (LASIX) 20 MG tablet Take 1 tablet (20 mg total) by mouth daily as needed for fluid. Patient taking differently: Take 20-40 mg by mouth See admin instructions. Takes 1 tablet daily and extra tablet if needed 09/10/15   Tanna Furry, MD  gabapentin (NEURONTIN) 100 MG capsule Take 200 mg by mouth 2 (two) times daily. 11/12/20   [provider]  hydrALAZINE (APRESOLINE) 50 MG tablet Take 1 tablet (50 mg total) by mouth every 8 (eight) hours. Patient taking differently: Take 50 mg by mouth 3 (three) times daily. 01/18/14   Dhungel, Nishant, MD  insulin glargine (LANTUS) 100 UNIT/ML injection Inject 20-48 Units into the skin in the morning. Self adjusts    [provider]  levothyroxine (SYNTHROID, LEVOTHROID) 50 MCG tablet Take 50 mcg by mouth daily before breakfast. 02/13/14   [provider]  methocarbamol (ROBAXIN) 500 MG tablet Take 500 mg by mouth as needed for muscle spasms. 09/30/20   [provider]  Multiple Vitamins-Minerals (MULTIVITAMIN WITH MINERALS) tablet Take 1 tablet by mouth daily.    [provider]  sitaGLIPtin (JANUVIA) 100 MG tablet Take 50 mg by mouth daily.    [provider]    Allergies    Benadryl [diphenhydramine] and Norvasc [amlodipine besylate]  Review of Systems   Review of Systems  All other systems reviewed and are negative.  Physical Exam Updated Vital Signs BP (!) 145/80   Pulse (!) 58   Temp 98.3 F (36.8 C) (Oral)   Resp 16   Ht 4\' 10"  (1.473 m)   Wt 81.6 kg   SpO2 94%   BMI 37.62 kg/m   Physical Exam Vitals and nursing note reviewed.  Constitutional:      General: She is not in acute  distress.    Appearance: She is well-developed. She is not ill-appearing, toxic-appearing or diaphoretic.  HENT:     Head: Normocephalic and atraumatic.  Eyes:     Conjunctiva/sclera: Conjunctivae normal.     Pupils: Pupils are equal, round, and reactive to light.  Neck:     Trachea: Phonation normal.  Cardiovascular:     Rate and Rhythm: Normal rate.  Pulmonary:     Effort: Pulmonary effort is normal.  Chest:     Chest wall: No tenderness.  Abdominal:     General: There is no distension.     Palpations: Abdomen is soft.  Musculoskeletal:        General: Normal range of motion.     Cervical back: Normal range of motion and neck supple.     Comments: Tender right lumbar, to light touch.  No altered range of motion of the back.  Negative straight leg raising bilaterally.  Skin:    General: Skin is warm and dry.  Neurological:     Mental Status: She is alert and oriented to person, place, and time.     Motor: No abnormal muscle tone.  Psychiatric:        Mood and Affect: Mood normal.        Behavior: Behavior normal.        Thought Content: Thought content normal.        Judgment: Judgment normal.    ED Results / Procedures / Treatments   Labs (all labs ordered are listed, but only abnormal results are displayed) Labs Reviewed - No data to display  EKG None  Radiology No results found.  Procedures Procedures   Medications Ordered in ED Medications - No data to display  ED Course  I have reviewed the triage vital signs and the nursing notes.  Pertinent labs & imaging results that were available during my care of the patient were reviewed by me and considered in my medical decision making (see chart for details).    MDM Rules/Calculators/A&P                            Patient Vitals for the past 24 hrs:  BP Temp Temp src Pulse Resp SpO2 Height Weight  06/20/21 0930 (!) 145/80 -- -- (!) 58 -- 94 % -- --  06/20/21 0900 (!) 153/76 -- -- 63 -- 94 % -- --   06/20/21 0834 (!) 156/77 98.3 F (36.8 C) Oral 66 16 95 % -- --  06/20/21 0831 (!) 199/80 -- -- 74 -- 95 % 4\' 10"  (1.473 m) 81.6 kg    9:58 AM Reevaluation with update and discussion. After initial assessment and treatment, an updated evaluation reveals no change in clinical status, findings discussed with the patient and all questions were answered. Daleen Bo   Medical Decision Making:  This patient is presenting for evaluation of low back pain rating to right leg, which does require a range of treatment options, and is a complaint that involves a moderate risk of morbidity and mortality. The differential diagnoses include muscle strain, degenerative joint disease, cauda equina syndrome. I decided to review old records, and in summary elderly female presenting with recurrent pain, despite treatment by physiatry.  She was unable to complete work duties today.  I did not reck additional historical information from anyone.    Critical Interventions-clinical evaluation, observation, discussion with patient  After These Interventions, the Patient was reevaluated and was found stable for discharge.  Patient is hesitant to take anti-inflammatories, reason unknown.  She does not appear to be in significant discomfort, and is at risk for hypoglycemia with steroid treatment.  We will give short course of narcotic pain reliever, keep her out of work a few days and refer her back to her physiatrist.  CRITICAL CARE-no Performed by: Daleen Bo  Nursing Notes Reviewed/ Care Coordinated Applicable Imaging Reviewed Interpretation of Laboratory Data incorporated into ED treatment  The patient appears reasonably screened and/or stabilized for  discharge and I doubt any other medical condition or other [facility] requiring further screening, evaluation, or treatment in the ED at this time prior to discharge.  Plan: Home Medications-continue usual, consider using ibuprofen 3 times daily with meals; Home  Treatments-rest, fluid; return here if the recommended treatment, does not improve the symptoms; Recommended follow up-physiatry is soon as possible for further management     Final Clinical Impression(s) / ED Diagnoses Final diagnoses:  Osteoarthritis of spine with radiculopathy, lumbar region    Rx / DC Orders ED Discharge Orders          Ordered    traMADol (ULTRAM) 50 MG tablet  Every 6 hours PRN        06/20/21 0956             Daleen Bo, MD 06/20/21 [phone]

## 2021-06-24 DIAGNOSIS — J449 Chronic obstructive pulmonary disease, unspecified: Secondary | ICD-10-CM | POA: Diagnosis not present

## 2021-06-24 DIAGNOSIS — D631 Anemia in chronic kidney disease: Secondary | ICD-10-CM | POA: Diagnosis not present

## 2021-06-24 DIAGNOSIS — N184 Chronic kidney disease, stage 4 (severe): Secondary | ICD-10-CM | POA: Diagnosis not present

## 2021-06-24 DIAGNOSIS — E1122 Type 2 diabetes mellitus with diabetic chronic kidney disease: Secondary | ICD-10-CM | POA: Diagnosis not present

## 2021-06-24 DIAGNOSIS — I129 Hypertensive chronic kidney disease with stage 1 through stage 4 chronic kidney disease, or unspecified chronic kidney disease: Secondary | ICD-10-CM | POA: Diagnosis not present

## 2021-06-30 DIAGNOSIS — M47816 Spondylosis without myelopathy or radiculopathy, lumbar region: Secondary | ICD-10-CM | POA: Diagnosis not present

## 2021-07-08 DIAGNOSIS — E1122 Type 2 diabetes mellitus with diabetic chronic kidney disease: Secondary | ICD-10-CM | POA: Diagnosis not present

## 2021-07-08 DIAGNOSIS — I5032 Chronic diastolic (congestive) heart failure: Secondary | ICD-10-CM | POA: Diagnosis not present

## 2021-07-08 DIAGNOSIS — N183 Chronic kidney disease, stage 3 unspecified: Secondary | ICD-10-CM | POA: Diagnosis not present

## 2021-07-08 DIAGNOSIS — I130 Hypertensive heart and chronic kidney disease with heart failure and stage 1 through stage 4 chronic kidney disease, or unspecified chronic kidney disease: Secondary | ICD-10-CM | POA: Diagnosis not present

## 2021-07-15 DIAGNOSIS — M47816 Spondylosis without myelopathy or radiculopathy, lumbar region: Secondary | ICD-10-CM | POA: Diagnosis not present

## 2021-08-07 DIAGNOSIS — I130 Hypertensive heart and chronic kidney disease with heart failure and stage 1 through stage 4 chronic kidney disease, or unspecified chronic kidney disease: Secondary | ICD-10-CM | POA: Diagnosis not present

## 2021-08-07 DIAGNOSIS — I5032 Chronic diastolic (congestive) heart failure: Secondary | ICD-10-CM | POA: Diagnosis not present

## 2021-08-07 DIAGNOSIS — E1122 Type 2 diabetes mellitus with diabetic chronic kidney disease: Secondary | ICD-10-CM | POA: Diagnosis not present

## 2021-08-07 DIAGNOSIS — N183 Chronic kidney disease, stage 3 unspecified: Secondary | ICD-10-CM | POA: Diagnosis not present

## 2021-08-18 ENCOUNTER — Other Ambulatory Visit (HOSPITAL_COMMUNITY): Payer: Self-pay | Admitting: Family Medicine

## 2021-08-18 DIAGNOSIS — Z1231 Encounter for screening mammogram for malignant neoplasm of breast: Secondary | ICD-10-CM

## 2021-08-26 DIAGNOSIS — M47816 Spondylosis without myelopathy or radiculopathy, lumbar region: Secondary | ICD-10-CM | POA: Diagnosis not present

## 2021-08-31 ENCOUNTER — Other Ambulatory Visit: Payer: Self-pay

## 2021-08-31 ENCOUNTER — Ambulatory Visit (HOSPITAL_COMMUNITY)
Admission: RE | Admit: 2021-08-31 | Discharge: 2021-08-31 | Disposition: A | Discharge status: Home / Self Care | Payer: Medicare Other | Source: Ambulatory Visit | Attending: Family Medicine | Admitting: Family Medicine

## 2021-08-31 DIAGNOSIS — Z1231 Encounter for screening mammogram for malignant neoplasm of breast: Secondary | ICD-10-CM | POA: Diagnosis not present

## 2021-09-07 DIAGNOSIS — I130 Hypertensive heart and chronic kidney disease with heart failure and stage 1 through stage 4 chronic kidney disease, or unspecified chronic kidney disease: Secondary | ICD-10-CM | POA: Diagnosis not present

## 2021-09-07 DIAGNOSIS — N183 Chronic kidney disease, stage 3 unspecified: Secondary | ICD-10-CM | POA: Diagnosis not present

## 2021-09-07 DIAGNOSIS — E1122 Type 2 diabetes mellitus with diabetic chronic kidney disease: Secondary | ICD-10-CM | POA: Diagnosis not present

## 2021-09-07 DIAGNOSIS — I5032 Chronic diastolic (congestive) heart failure: Secondary | ICD-10-CM | POA: Diagnosis not present

## 2021-09-08 DIAGNOSIS — I5032 Chronic diastolic (congestive) heart failure: Secondary | ICD-10-CM | POA: Diagnosis not present

## 2021-09-08 DIAGNOSIS — I130 Hypertensive heart and chronic kidney disease with heart failure and stage 1 through stage 4 chronic kidney disease, or unspecified chronic kidney disease: Secondary | ICD-10-CM | POA: Diagnosis not present

## 2021-09-08 DIAGNOSIS — Z0000 Encounter for general adult medical examination without abnormal findings: Secondary | ICD-10-CM | POA: Diagnosis not present

## 2021-09-08 DIAGNOSIS — D631 Anemia in chronic kidney disease: Secondary | ICD-10-CM | POA: Diagnosis not present

## 2021-09-08 DIAGNOSIS — E1122 Type 2 diabetes mellitus with diabetic chronic kidney disease: Secondary | ICD-10-CM | POA: Diagnosis not present

## 2021-09-08 DIAGNOSIS — M184 Other bilateral secondary osteoarthritis of first carpometacarpal joints: Secondary | ICD-10-CM | POA: Diagnosis not present

## 2021-09-08 DIAGNOSIS — N184 Chronic kidney disease, stage 4 (severe): Secondary | ICD-10-CM | POA: Diagnosis not present

## 2021-09-08 DIAGNOSIS — J219 Acute bronchiolitis, unspecified: Secondary | ICD-10-CM | POA: Diagnosis not present

## 2021-09-16 ENCOUNTER — Encounter: Payer: Self-pay | Admitting: Gastroenterology

## 2021-09-16 ENCOUNTER — Ambulatory Visit: Payer: Medicare Other | Admitting: Gastroenterology

## 2021-09-16 ENCOUNTER — Other Ambulatory Visit: Payer: Self-pay

## 2021-09-16 VITALS — BP 152/73 | HR 69 | Temp 97.0°F | Ht 59.0 in | Wt 190.0 lb

## 2021-09-16 DIAGNOSIS — K640 First degree hemorrhoids: Secondary | ICD-10-CM

## 2021-09-16 NOTE — Patient Instructions (Signed)
I believe this banding will take care of your symptoms!  If not, please call.  Your next colonoscopy will be in 2027. We will send a letter closer to time!  Please call with any concerns in the meantime, and we will see you back as needed!  I enjoyed seeing you again today! As you know, I value our relationship and want to provide genuine, compassionate, and quality care. I welcome your feedback. If you receive a survey regarding your visit,  I greatly appreciate you taking time to fill this out. See you next time!  Annitta Needs, PhD, ANP-BC [facility]

## 2021-09-16 NOTE — Progress Notes (Signed)
[place] Banding Note:   Rebekah Ray is a 65 y.o. female presenting today for consideration of hemorrhoid banding. Last colonoscopy Jan 2022 with non-bleeding internal hemorrhoids, few small-mouthed diverticula, 2 mm polyp in ascending, 4 mm polyp in transverse. Tubular adenoma. Surveillance 5 years. Underwent left lateral column banding in March 2022, right anterior in July 2022, and here for discussion of banding. At last appointment, the right posterior did not seem to have sufficient tissue. She notes almost near resolution of symptoms. Scant bleeding at times.    The patient presents with symptomatic grade 1-2 hemorrhoids, unresponsive to maximal medical therapy, requesting rubber band ligation of his/her hemorrhoidal disease. All risks, benefits, and alternative forms of therapy were described and informed consent was obtained.   The decision was made to band the neutrally, and the [place] was used to perform band ligation without complication. Digital anorectal examination was then performed to assure proper positioning of the band, and to adjust the banded tissue as required. The patient was discharged home without pain or other issues. Dietary and behavioral recommendations were given and (if necessary prescriptions were given), along with follow-up instructions. The patient will return as needed. Next colonoscopy due in 2027.   No complications were encountered and the patient tolerated the procedure well.   Annitta Needs, PhD, ANP-BC [facility]

## 2021-09-22 DIAGNOSIS — E1142 Type 2 diabetes mellitus with diabetic polyneuropathy: Secondary | ICD-10-CM | POA: Diagnosis not present

## 2021-09-22 DIAGNOSIS — I10 Essential (primary) hypertension: Secondary | ICD-10-CM | POA: Diagnosis not present

## 2021-09-22 DIAGNOSIS — J219 Acute bronchiolitis, unspecified: Secondary | ICD-10-CM | POA: Diagnosis not present

## 2021-09-22 DIAGNOSIS — Z23 Encounter for immunization: Secondary | ICD-10-CM | POA: Diagnosis not present

## 2021-09-28 DIAGNOSIS — M47816 Spondylosis without myelopathy or radiculopathy, lumbar region: Secondary | ICD-10-CM | POA: Diagnosis not present

## 2021-09-29 DIAGNOSIS — E1122 Type 2 diabetes mellitus with diabetic chronic kidney disease: Secondary | ICD-10-CM | POA: Diagnosis not present

## 2021-09-29 DIAGNOSIS — D631 Anemia in chronic kidney disease: Secondary | ICD-10-CM | POA: Diagnosis not present

## 2021-09-29 DIAGNOSIS — N184 Chronic kidney disease, stage 4 (severe): Secondary | ICD-10-CM | POA: Diagnosis not present

## 2021-09-29 DIAGNOSIS — I129 Hypertensive chronic kidney disease with stage 1 through stage 4 chronic kidney disease, or unspecified chronic kidney disease: Secondary | ICD-10-CM | POA: Diagnosis not present

## 2021-09-29 DIAGNOSIS — E1165 Type 2 diabetes mellitus with hyperglycemia: Secondary | ICD-10-CM | POA: Diagnosis not present

## 2021-10-07 DIAGNOSIS — E1122 Type 2 diabetes mellitus with diabetic chronic kidney disease: Secondary | ICD-10-CM | POA: Diagnosis not present

## 2021-10-07 DIAGNOSIS — I130 Hypertensive heart and chronic kidney disease with heart failure and stage 1 through stage 4 chronic kidney disease, or unspecified chronic kidney disease: Secondary | ICD-10-CM | POA: Diagnosis not present

## 2021-10-07 DIAGNOSIS — N183 Chronic kidney disease, stage 3 unspecified: Secondary | ICD-10-CM | POA: Diagnosis not present

## 2021-10-07 DIAGNOSIS — I5032 Chronic diastolic (congestive) heart failure: Secondary | ICD-10-CM | POA: Diagnosis not present

## 2021-10-27 DIAGNOSIS — E1165 Type 2 diabetes mellitus with hyperglycemia: Secondary | ICD-10-CM | POA: Diagnosis not present

## 2021-10-27 DIAGNOSIS — Z794 Long term (current) use of insulin: Secondary | ICD-10-CM | POA: Diagnosis not present

## 2021-10-27 DIAGNOSIS — I129 Hypertensive chronic kidney disease with stage 1 through stage 4 chronic kidney disease, or unspecified chronic kidney disease: Secondary | ICD-10-CM | POA: Diagnosis not present

## 2021-10-27 DIAGNOSIS — N182 Chronic kidney disease, stage 2 (mild): Secondary | ICD-10-CM | POA: Diagnosis not present

## 2021-10-27 DIAGNOSIS — E1122 Type 2 diabetes mellitus with diabetic chronic kidney disease: Secondary | ICD-10-CM | POA: Diagnosis not present

## 2021-11-24 DIAGNOSIS — I10 Essential (primary) hypertension: Secondary | ICD-10-CM | POA: Diagnosis not present

## 2021-11-24 DIAGNOSIS — E1169 Type 2 diabetes mellitus with other specified complication: Secondary | ICD-10-CM | POA: Diagnosis not present

## 2021-11-30 ENCOUNTER — Other Ambulatory Visit: Payer: Self-pay

## 2021-11-30 MED ORDER — CARVEDILOL 25 MG PO TABS: 25.0000 mg | ORAL_TABLET | Freq: Two times a day (BID) | ORAL | 1 refills | Status: AC

## 2021-12-01 DIAGNOSIS — I10 Essential (primary) hypertension: Secondary | ICD-10-CM | POA: Diagnosis not present

## 2021-12-08 DIAGNOSIS — I10 Essential (primary) hypertension: Secondary | ICD-10-CM | POA: Diagnosis not present

## 2021-12-13 ENCOUNTER — Other Ambulatory Visit: Payer: Self-pay | Admitting: Cardiovascular Disease

## 2021-12-22 DIAGNOSIS — N184 Chronic kidney disease, stage 4 (severe): Secondary | ICD-10-CM | POA: Diagnosis not present

## 2021-12-22 DIAGNOSIS — I129 Hypertensive chronic kidney disease with stage 1 through stage 4 chronic kidney disease, or unspecified chronic kidney disease: Secondary | ICD-10-CM | POA: Diagnosis not present

## 2021-12-22 DIAGNOSIS — D631 Anemia in chronic kidney disease: Secondary | ICD-10-CM | POA: Diagnosis not present

## 2021-12-22 DIAGNOSIS — R32 Unspecified urinary incontinence: Secondary | ICD-10-CM | POA: Diagnosis not present

## 2021-12-22 DIAGNOSIS — J449 Chronic obstructive pulmonary disease, unspecified: Secondary | ICD-10-CM | POA: Diagnosis not present

## 2021-12-22 DIAGNOSIS — Z6841 Body Mass Index (BMI) 40.0 and over, adult: Secondary | ICD-10-CM | POA: Diagnosis not present

## 2021-12-22 DIAGNOSIS — E1122 Type 2 diabetes mellitus with diabetic chronic kidney disease: Secondary | ICD-10-CM | POA: Diagnosis not present

## 2022-01-05 DIAGNOSIS — E1165 Type 2 diabetes mellitus with hyperglycemia: Secondary | ICD-10-CM | POA: Diagnosis not present

## 2022-01-05 DIAGNOSIS — N184 Chronic kidney disease, stage 4 (severe): Secondary | ICD-10-CM | POA: Diagnosis not present

## 2022-01-05 DIAGNOSIS — E1122 Type 2 diabetes mellitus with diabetic chronic kidney disease: Secondary | ICD-10-CM | POA: Diagnosis not present

## 2022-01-05 DIAGNOSIS — I10 Essential (primary) hypertension: Secondary | ICD-10-CM | POA: Diagnosis not present

## 2022-01-05 DIAGNOSIS — I5032 Chronic diastolic (congestive) heart failure: Secondary | ICD-10-CM | POA: Diagnosis not present

## 2022-01-21 ENCOUNTER — Emergency Department (HOSPITAL_COMMUNITY)
Admission: EM | Admit: 2022-01-21 | Discharge: 2022-01-21 | Disposition: A | Discharge status: Home / Self Care | Payer: Medicare HMO | Attending: Emergency Medicine | Admitting: Emergency Medicine

## 2022-01-21 ENCOUNTER — Other Ambulatory Visit: Payer: Self-pay

## 2022-01-21 ENCOUNTER — Emergency Department (HOSPITAL_COMMUNITY): Payer: Medicare HMO

## 2022-01-21 ENCOUNTER — Encounter (HOSPITAL_COMMUNITY): Payer: Self-pay

## 2022-01-21 DIAGNOSIS — Z7982 Long term (current) use of aspirin: Secondary | ICD-10-CM | POA: Diagnosis not present

## 2022-01-21 DIAGNOSIS — E1122 Type 2 diabetes mellitus with diabetic chronic kidney disease: Secondary | ICD-10-CM | POA: Diagnosis not present

## 2022-01-21 DIAGNOSIS — Z6841 Body Mass Index (BMI) 40.0 and over, adult: Secondary | ICD-10-CM | POA: Diagnosis not present

## 2022-01-21 DIAGNOSIS — X58XXXA Exposure to other specified factors, initial encounter: Secondary | ICD-10-CM | POA: Insufficient documentation

## 2022-01-21 DIAGNOSIS — M25551 Pain in right hip: Secondary | ICD-10-CM | POA: Diagnosis not present

## 2022-01-21 DIAGNOSIS — J449 Chronic obstructive pulmonary disease, unspecified: Secondary | ICD-10-CM | POA: Diagnosis not present

## 2022-01-21 DIAGNOSIS — N184 Chronic kidney disease, stage 4 (severe): Secondary | ICD-10-CM | POA: Diagnosis not present

## 2022-01-21 DIAGNOSIS — D631 Anemia in chronic kidney disease: Secondary | ICD-10-CM | POA: Diagnosis not present

## 2022-01-21 DIAGNOSIS — Y990 Civilian activity done for income or pay: Secondary | ICD-10-CM | POA: Insufficient documentation

## 2022-01-21 DIAGNOSIS — I129 Hypertensive chronic kidney disease with stage 1 through stage 4 chronic kidney disease, or unspecified chronic kidney disease: Secondary | ICD-10-CM | POA: Diagnosis not present

## 2022-01-21 DIAGNOSIS — Z794 Long term (current) use of insulin: Secondary | ICD-10-CM | POA: Diagnosis not present

## 2022-01-21 DIAGNOSIS — Z79899 Other long term (current) drug therapy: Secondary | ICD-10-CM | POA: Diagnosis not present

## 2022-01-21 DIAGNOSIS — R32 Unspecified urinary incontinence: Secondary | ICD-10-CM | POA: Diagnosis not present

## 2022-01-21 MED ORDER — DEXAMETHASONE SODIUM PHOSPHATE 10 MG/ML IJ SOLN
10.0000 mg | Freq: Once | INTRAMUSCULAR | Status: AC
  Administered 2022-01-21: 10 mg via INTRAMUSCULAR
  Filled 2022-01-21: qty 1

## 2022-01-21 MED ORDER — HYDROCODONE-ACETAMINOPHEN 5-325 MG PO TABS
ORAL_TABLET | ORAL | 0 refills | Status: DC
Start: 2022-01-21 — End: 2023-07-05

## 2022-01-21 MED ORDER — OXYCODONE-ACETAMINOPHEN 5-325 MG PO TABS
1.0000 | ORAL_TABLET | Freq: Once | ORAL | Status: AC
  Administered 2022-01-21: 1 via ORAL
  Filled 2022-01-21: qty 1

## 2022-01-21 NOTE — ED Provider Notes (Signed)
?[place] ?Provider Note ? ? ?CSN: [id number] ?Arrival date & time: 01/21/22  6160 ? ?  ? ?History ? ?Chief Complaint  ?Patient presents with  ? Hip Pain  ? ? ?Rebekah Ray is a 66 y.o. female. ? ? ?Hip Pain ?Pertinent negatives include no chest pain and no shortness of breath.  ? ?  ? ? ?Rebekah Ray is a 66 y.o. female who presents to the Emergency Department complaining of right hip pain that began while at work this morning.  States that she went to the restroom and when she stood up from the toilet she had a sharp pain to her right anterior and lateral hip area.  She reports history of sciatica and recurrent right hip pain.  She denies fall this morning.  Pain is radiating into her right thigh as well.  No numbness or weakness of her lower extremities.  She is currently followed by physiatrist ? ? ?Home Medications ?Prior to Admission medications   ?Medication Sig Start Date End Date Taking? Authorizing Provider  ?acetaminophen (TYLENOL) 500 MG tablet Take 500 mg by mouth as needed for mild pain.   Yes [provider]  ?albuterol (PROVENTIL HFA;VENTOLIN HFA) 108 (90 BASE) MCG/ACT inhaler Inhale 2 puffs into the lungs every 6 (six) hours as needed for wheezing or shortness of breath. ?Patient taking differently: Inhale 2 puffs into the lungs as needed for wheezing or shortness of breath. 01/18/14  Yes Dhungel, Nishant, MD  ?aspirin EC 81 MG tablet Take 81 mg by mouth daily.   Yes [provider]  ?atorvastatin (LIPITOR) 40 MG tablet Take 1 tablet (40 mg total) by mouth daily. Keep upcoming appointment for future refills. 12/14/21  Yes O'Neal, Cassie Freer, MD  ?carvedilol (COREG) 25 MG tablet Take 1 tablet (25 mg total) by mouth 2 (two) times daily with a meal. 11/30/21  Yes O'Neal, Cassie Freer, MD  ?fenofibrate (TRICOR) 48 MG tablet Take 48 mg by mouth daily. 09/25/18  Yes [provider]  ?ferrous sulfate 325 (65 FE) MG tablet Take 162.5 mg by mouth 2 (two)  times a week.   Yes [provider]  ?furosemide (LASIX) 40 MG tablet Take 40 mg by mouth 2 (two) times daily. 12/22/21  Yes [provider]  ?gabapentin (NEURONTIN) 100 MG capsule Take 200 mg by mouth 2 (two) times daily. 11/12/20  Yes [provider]  ?hydrALAZINE (APRESOLINE) 50 MG tablet Take 1 tablet (50 mg total) by mouth every 8 (eight) hours. ?Patient taking differently: Take 50 mg by mouth 3 (three) times daily. 01/18/14  Yes Dhungel, Nishant, MD  ?insulin glargine (LANTUS) 100 UNIT/ML injection Inject 20-48 Units into the skin in the morning. Self adjusts   Yes [provider]  ?levothyroxine (SYNTHROID, LEVOTHROID) 50 MCG tablet Take 50 mcg by mouth daily before breakfast. 02/13/14  Yes [provider]  ?Multiple Vitamin (MULTIVITAMIN) tablet Take 1 tablet by mouth daily.   Yes [provider]  ?Multiple Vitamins-Minerals (MULTIVITAMIN WITH MINERALS) tablet Take 1 tablet by mouth daily.   Yes [provider]  ?spironolactone (ALDACTONE) 25 MG tablet Take 25 mg by mouth daily. 12/22/21  Yes [provider]  ?traMADol (ULTRAM) 50 MG tablet Take 1 tablet (50 mg total) by mouth every 6 (six) hours as needed. 06/20/21  Yes Daleen Bo, MD  ?   ? ?Allergies    ?Benadryl [diphenhydramine] and Norvasc [amlodipine besylate]   ? ?Review of Systems   ?Review of  Systems  ?Constitutional:  Negative for appetite change and fever.  ?Respiratory:  Negative for shortness of breath.   ?Cardiovascular:  Negative for chest pain.  ?Gastrointestinal:  Negative for diarrhea, nausea and vomiting.  ?Musculoskeletal:  Positive for arthralgias (Right hip pain).  ?Skin:  Negative for color change and rash.  ?Neurological:  Negative for dizziness, syncope, weakness and numbness.  ?All other systems reviewed and are negative. ? ?Physical Exam ?Updated Vital Signs ?BP 129/69   Pulse (!) 57   Temp 98 ?F (36.7 ?C) (Oral)   Resp 18   Ht '4\' 10"'$  (1.473 m)   Wt 82.1 kg    SpO2 97%   BMI 37.83 kg/m?  ?Physical Exam ?Vitals and nursing note reviewed.  ?Constitutional:   ?   Appearance: Normal appearance.  ?Cardiovascular:  ?   Rate and Rhythm: Normal rate and regular rhythm.  ?   Pulses: Normal pulses.  ?Pulmonary:  ?   Effort: Pulmonary effort is normal.  ?   Breath sounds: Normal breath sounds.  ?Abdominal:  ?   General: There is no distension.  ?   Palpations: Abdomen is soft.  ?   Tenderness: There is no abdominal tenderness. There is no right CVA tenderness or left CVA tenderness.  ?Musculoskeletal:     ?   General: Tenderness present. No swelling or signs of injury.  ?   Right hip: Tenderness and bony tenderness present. No crepitus. Normal range of motion.  ?   Right lower leg: No edema.  ?   Left lower leg: No edema.  ?   Comments: Tenderness with range of motion of the anterior and lateral right hip.  No crepitus.    ?Skin: ?   General: Skin is warm.  ?   Capillary Refill: Capillary refill takes less than 2 seconds.  ?   Findings: No rash.  ?Neurological:  ?   General: No focal deficit present.  ?   Mental Status: She is alert.  ?   Sensory: No sensory deficit.  ?   Motor: No weakness.  ? ? ?ED Results / Procedures / Treatments   ?Labs ?(all labs ordered are listed, but only abnormal results are displayed) ?Labs Reviewed - No data to display ? ?EKG ?None ? ?Radiology ?DG Hip Unilat W or Wo Pelvis 2-3 Views Right ? ?Result Date: 01/21/2022 ?CLINICAL DATA:  Right hip pain after standing up from toilet. EXAM: DG HIP (WITH OR WITHOUT PELVIS) 2-3V RIGHT COMPARISON:  Pelvis and right hip radiographs 09/11/2020 FINDINGS: Mild bilateral femoroacetabular joint space narrowing. Moderate bilateral superolateral acetabular degenerative osteophytes. There is again a well corticated ossicle measuring up to 2.9 cm superior to the right greater trochanter chronic. Mild pubic symphysis and bilateral sacroiliac joint space narrowing and subchondral sclerosis osteoarthritis. No acute fracture  or dislocation. IMPRESSION: Mild-to-moderate bilateral femoroacetabular osteoarthritis, similar to prior. Electronically Signed   By: Yvonne Kendall M.D.   On: 01/21/2022 10:55   ? ?Procedures ?Procedures  ? ? ?Medications Ordered in ED ?Medications  ?dexamethasone (DECADRON) injection 10 mg (has no administration in time range)  ?oxyCODONE-acetaminophen (PERCOCET/ROXICET) 5-325 MG per tablet 1 tablet (1 tablet Oral Given 01/21/22 1001)  ? ? ?ED Course/ Medical Decision Making/ A&P ?  ?                        ?Medical Decision Making ?Amount and/or Complexity of Data Reviewed ?Radiology: ordered. ? ?Risk ?Prescription drug management. ? ? ?Patient here  with likely acute on chronic right hip pain.  Pain began today after standing from a seated position, no fall.  Pain with range of motion of the hip.  Improves at rest.  X-ray shows some degenerative changes of the acetabulum.  No abdominal tenderness on exam.  Neurovascularly intact. ? ?Patient unable to tolerate NSAIDs due to kidney disease, and prefers not to take steroids due to causing elevated blood sugars.  Will provide short course of pain medication.  Database reviewed.  Single injection of IM Decadron given here she will follow-up with her physiatrist.  Appears appropriate for discharge home, all questions were answered. ? ? ? ? ? ? ? ?Final Clinical Impression(s) / ED Diagnoses ?Final diagnoses:  ?Right hip pain  ? ? ?Rx / DC Orders ?ED Discharge Orders   ? ? None  ? ?  ? ? ?  ?Kem Parkinson, PA-C ?01/21/22 1251 ? ?  ?[place] Ferguson, MD ?01/22/22 1116 ? ?

## 2022-01-21 NOTE — ED Triage Notes (Signed)
Patient complaining of right hip pain after standing up from the toilet. Did not fall or twist hip and is unable to bare weight.  ?

## 2022-01-21 NOTE — Discharge Instructions (Signed)
Try using over-the-counter 4% lidocaine patches as directed.  Apply the patch directly to your hip area.  Avoid excessive standing or walking for several days.  Follow-up with your pain management provider for recheck if your symptoms are not improving. ?

## 2022-02-02 DIAGNOSIS — I10 Essential (primary) hypertension: Secondary | ICD-10-CM | POA: Diagnosis not present

## 2022-02-02 DIAGNOSIS — E1165 Type 2 diabetes mellitus with hyperglycemia: Secondary | ICD-10-CM | POA: Diagnosis not present

## 2022-02-02 DIAGNOSIS — E1122 Type 2 diabetes mellitus with diabetic chronic kidney disease: Secondary | ICD-10-CM | POA: Diagnosis not present

## 2022-02-05 DIAGNOSIS — Z0100 Encounter for examination of eyes and vision without abnormal findings: Secondary | ICD-10-CM | POA: Diagnosis not present

## 2022-02-05 DIAGNOSIS — E119 Type 2 diabetes mellitus without complications: Secondary | ICD-10-CM | POA: Diagnosis not present

## 2022-02-05 DIAGNOSIS — I130 Hypertensive heart and chronic kidney disease with heart failure and stage 1 through stage 4 chronic kidney disease, or unspecified chronic kidney disease: Secondary | ICD-10-CM | POA: Diagnosis not present

## 2022-02-05 DIAGNOSIS — E1122 Type 2 diabetes mellitus with diabetic chronic kidney disease: Secondary | ICD-10-CM | POA: Diagnosis not present

## 2022-02-05 DIAGNOSIS — I5032 Chronic diastolic (congestive) heart failure: Secondary | ICD-10-CM | POA: Diagnosis not present

## 2022-02-05 DIAGNOSIS — N183 Chronic kidney disease, stage 3 unspecified: Secondary | ICD-10-CM | POA: Diagnosis not present

## 2022-02-09 NOTE — Progress Notes (Signed)
?Cardiology Office Note:   ?Date:  02/10/2022  ?NAME:  Rebekah Ray    ?MRN: [id number] ?DOB:  [birth year]  ? ?PCP:  Iona Beard, MD  ?Cardiologist:  None  ?Electrophysiologist:  None  ? ?Referring MD: Iona Beard, MD  ? ?Chief Complaint  ?Patient presents with  ? Follow-up  ?  1 year.  ? ?History of Present Illness:   ?Rebekah Ray is a 66 y.o. female with a hx of CKD stage III, hypertension, diabetes, HFpEF who presents for follow-up.  She reports she is doing well.  Denies any chest pain or trouble breathing.  Volume status well controlled on Lasix 40 mg twice a day.  Blood pressure controlled.  Cholesterol level has been acceptable.  She reports no chest pain or trouble breathing.  She is suffering from back as well as knee pain.  This appears to be arthritis related.  No issues regarding her cardiovascular system.  She is working on her diabetes.  Not well controlled.  Overall seems to be doing quite well with stable kidney function. ? ?Problem List ?1. HFpEF ?-EF 55-60% 2019 [facility]) ?-NM Stress negative 2016 [facility]) ?2. CKD 3 ?-eGFR 35 ?3. HTN ?4. Aortic valve sclerosis  ?5. Former Tobacco Abuse  ?6.  Hyperlipidemia ?-Total cholesterol 141, HDL 39, LDL 74, triglycerides 180 ?7.  Diabetes ?-A1c 9.1 ? ?Past Medical History: ?Past Medical History:  ?Diagnosis Date  ? COPD (chronic obstructive pulmonary disease) ([place])   ? Diabetes mellitus without complication ([place])   ? Hyperlipidemia   ? Hypertension   ? Renal disorder   ? Thyroid disease   ? ? ?Past Surgical History: ?Past Surgical History:  ?Procedure Laterality Date  ? BREAST BIOPSY Right   ? CESAREAN SECTION    ? COLONOSCOPY WITH PROPOFOL N/A 11/11/2020  ? non-bleeding internal hemorrhoids, few small-mouthed diverticula, 2 mm polyp in ascending, 4 mm polyp in transverse. Tubular adenoma. Surveillance 5 years.   ? FOOT SURGERY    ? POLYPECTOMY  11/11/2020  ? Procedure: POLYPECTOMY;  Surgeon: Eloise Harman, DO;  Location: [facility];  Service: Endoscopy;;   ? TUBAL LIGATION    ? ? ?Current Medications: ?Current Meds  ?Medication Sig  ? albuterol (PROVENTIL HFA;VENTOLIN HFA) 108 (90 BASE) MCG/ACT inhaler Inhale 2 puffs into the lungs every 6 (six) hours as needed for wheezing or shortness of breath. (Patient taking differently: Inhale 2 puffs into the lungs as needed for wheezing or shortness of breath.)  ? aspirin EC 81 MG tablet Take 81 mg by mouth daily.  ? atorvastatin (LIPITOR) 40 MG tablet Take 1 tablet (40 mg total) by mouth daily. Keep upcoming appointment for future refills.  ? carvedilol (COREG) 25 MG tablet Take 1 tablet (25 mg total) by mouth 2 (two) times daily with a meal.  ? fenofibrate (TRICOR) 48 MG tablet Take 48 mg by mouth daily.  ? ferrous sulfate 325 (65 FE) MG tablet Take 162.5 mg by mouth 2 (two) times a week.  ? furosemide (LASIX) 40 MG tablet Take 40 mg by mouth 2 (two) times daily.  ? gabapentin (NEURONTIN) 100 MG capsule Take 200 mg by mouth 2 (two) times daily.  ? hydrALAZINE (APRESOLINE) 50 MG tablet Take 1 tablet (50 mg total) by mouth every 8 (eight) hours. (Patient taking differently: Take 100 mg by mouth in the morning and at bedtime.)  ? HYDROcodone-acetaminophen (NORCO/VICODIN) 5-325 MG tablet Take one tab po q 4 hrs prn pain  ? insulin glargine (LANTUS)  100 UNIT/ML injection Inject 20-48 Units into the skin in the morning. Self adjusts  ? levothyroxine (SYNTHROID, LEVOTHROID) 50 MCG tablet Take 50 mcg by mouth daily before breakfast.  ? Multiple Vitamin (MULTIVITAMIN) tablet Take 1 tablet by mouth daily.  ? Multiple Vitamins-Minerals (MULTIVITAMIN WITH MINERALS) tablet Take 1 tablet by mouth daily.  ? sitaGLIPtin (JANUVIA) 25 MG tablet Take 25 mg by mouth daily.  ? spironolactone (ALDACTONE) 25 MG tablet Take 25 mg by mouth daily.  ?  ? ?Allergies:    ?Benadryl [diphenhydramine] and Norvasc [amlodipine besylate]  ? ?Social History: ?Social History  ? ?Socioeconomic History  ? Marital status: Divorced  ?  Spouse name: Not on file  ?  Number of children: 2  ? Years of education: Not on file  ? Highest education level: Not on file  ?Occupational History  ? Occupation: Museum/gallery curator  ?Tobacco Use  ? Smoking status: Former  ?  Packs/day: 2.00  ?  Years: 42.00  ?  Pack years: 84.00  ?  Types: Cigarettes  ?  Quit date: 01/22/2014  ?  Years since quitting: 8.0  ? Smokeless tobacco: Former  ?Substance and Sexual Activity  ? Alcohol use: No  ? Drug use: No  ? Sexual activity: Never  ?Other Topics Concern  ? Not on file  ?Social History Narrative  ? Not on file  ? ?Social Determinants of Health  ? ?Financial Resource Strain: Not on file  ?Food Insecurity: Not on file  ?Transportation Needs: Not on file  ?Physical Activity: Not on file  ?Stress: Not on file  ?Social Connections: Not on file  ?  ? ?Family History: ?The patient's family history includes Diabetes in her father; Heart Problems in her mother; Heart attack in her father and mother; Kidney disease in her father and mother; Ovarian cancer in her mother; Stroke in her brother. ? ?ROS:   ?All other ROS reviewed and negative. Pertinent positives noted in the HPI.    ? ?EKGs/Labs/Other Studies Reviewed:   ?The following studies were personally reviewed by me today: ? ?EKG:  EKG is ordered today.  The ekg ordered today demonstrates NSR 73 bpm, and was personally reviewed by me.  ? ?Recent Labs: ?No results found for requested labs within last 8760 hours.  ? ?Recent Lipid Panel ?No results found for: CHOL, TRIG, HDL, CHOLHDL, VLDL, LDLCALC, LDLDIRECT ? ?Physical Exam:   ?VS:  BP 136/78 (BP Location: Left Arm, Patient Position: Sitting, Cuff Size: Large)   Pulse 73   Ht '4\' 11"'  (1.499 m)   Wt 179 lb (81.2 kg)   BMI 36.15 kg/m?    ?Wt Readings from Last 3 Encounters:  ?02/10/22 179 lb (81.2 kg)  ?01/21/22 181 lb (82.1 kg)  ?09/16/21 190 lb (86.2 kg)  ?  ?General: Well nourished, well developed, in no acute distress ?Head: Atraumatic, normal size  ?Eyes: PEERLA, EOMI  ?Neck: Supple, no JVD ?Endocrine: No  thryomegaly ?Cardiac: Normal S1, S2; RRR; no murmurs, rubs, or gallops ?Lungs: Clear to auscultation bilaterally, no wheezing, rhonchi or rales  ?Abd: Soft, nontender, no hepatomegaly  ?Ext: No edema, pulses 2+ ?Musculoskeletal: No deformities, BUE and BLE strength normal and equal ?Skin: Warm and dry, no rashes   ?Neuro: Alert and oriented to person, place, time, and situation, CNII-XII grossly intact, no focal deficits  ?Psych: Normal mood and affect  ? ?ASSESSMENT:   ?Rebekah Ray is a 66 y.o. female who presents for the following: ?1. Chronic diastolic heart failure (  [place])   ?2. Primary hypertension   ?3. Mixed hyperlipidemia   ? ? ?PLAN:   ?1. Chronic diastolic heart failure ([place]) ?-Euvolemic on exam.  Echo with normal LV function.  She will continue Lasix 40 mg twice daily.  She is on Aldactone which will help potassium.  Overall is doing well.  Advised to watch salt intake as well as to control other cardiovascular risk factors.  She needs to work on her diabetes. ? ?2. Primary hypertension ?-Well-controlled.  No change in medications. ? ?3. Mixed hyperlipidemia ?-Statin therapy.  She should continue this due to diabetes. ? ?Disposition: Return in about 1 year (around 02/11/2023). ? ?Medication Adjustments/Labs and Tests Ordered: ?Current medicines are reviewed at length with the patient today.  Concerns regarding medicines are outlined above.  ?Orders Placed This Encounter  ?Procedures  ? EKG 12-Lead  ? ?No orders of the defined types were placed in this encounter. ? ? ?Patient Instructions  ?Medication Instructions:  ?The current medical regimen is effective;  continue present plan and medications. ? ?*If you need a refill on your cardiac medications before your next appointment, please call your pharmacy* ? ? ?Follow-Up: ?At [facility], you and your health needs are our priority.  As part of our continuing mission to provide you with exceptional heart care, we have created designated Provider Care Teams.   These Care Teams include your primary Cardiologist (physician) and Advanced Practice Providers (APPs -  Physician Assistants and Nurse Practitioners) who all work together to provide you with the care

## 2022-02-10 ENCOUNTER — Ambulatory Visit: Payer: Medicare HMO | Admitting: Cardiovascular Disease

## 2022-02-10 ENCOUNTER — Encounter: Payer: Self-pay | Admitting: Cardiovascular Disease

## 2022-02-10 VITALS — BP 136/78 | HR 73 | Ht 59.0 in | Wt 179.0 lb

## 2022-02-10 DIAGNOSIS — I10 Essential (primary) hypertension: Secondary | ICD-10-CM | POA: Diagnosis not present

## 2022-02-10 DIAGNOSIS — I5032 Chronic diastolic (congestive) heart failure: Secondary | ICD-10-CM

## 2022-02-10 DIAGNOSIS — E782 Mixed hyperlipidemia: Secondary | ICD-10-CM

## 2022-02-10 NOTE — Patient Instructions (Addendum)
Medication Instructions:  ?The current medical regimen is effective;  continue present plan and medications. ? ?*If you need a refill on your cardiac medications before your next appointment, please call your pharmacy* ? ? ?Follow-Up: ?At [facility], you and your health needs are our priority.  As part of our continuing mission to provide you with exceptional heart care, we have created designated Provider Care Teams.  These Care Teams include your primary Cardiologist (physician) and Advanced Practice Providers (APPs -  Physician Assistants and Nurse Practitioners) who all work together to provide you with the care you need, when you need it. ? ?We recommend signing up for the patient portal called "MyChart".  Sign up information is provided on this After Visit Summary.  MyChart is used to connect with patients for Virtual Visits (Telemedicine).  Patients are able to view lab/test results, encounter notes, upcoming appointments, etc.  Non-urgent messages can be sent to your provider as well.   ?To learn more about what you can do with MyChart, go to [email or web address].   ? ?Your next appointment:   ?12 month(s) ? ?The format for your next appointment:   ?In Person ? ?Provider:   ?Eleonore Chiquito, MD or Sande Rives, PA-C or Almyra Deforest, PA-C ? ? ? ?

## 2022-03-06 DIAGNOSIS — N184 Chronic kidney disease, stage 4 (severe): Secondary | ICD-10-CM | POA: Diagnosis not present

## 2022-03-06 DIAGNOSIS — E1122 Type 2 diabetes mellitus with diabetic chronic kidney disease: Secondary | ICD-10-CM | POA: Diagnosis not present

## 2022-03-06 DIAGNOSIS — E1165 Type 2 diabetes mellitus with hyperglycemia: Secondary | ICD-10-CM | POA: Diagnosis not present

## 2022-03-06 DIAGNOSIS — I10 Essential (primary) hypertension: Secondary | ICD-10-CM | POA: Diagnosis not present

## 2022-03-07 DIAGNOSIS — E1122 Type 2 diabetes mellitus with diabetic chronic kidney disease: Secondary | ICD-10-CM | POA: Diagnosis not present

## 2022-03-07 DIAGNOSIS — I130 Hypertensive heart and chronic kidney disease with heart failure and stage 1 through stage 4 chronic kidney disease, or unspecified chronic kidney disease: Secondary | ICD-10-CM | POA: Diagnosis not present

## 2022-04-06 DIAGNOSIS — D631 Anemia in chronic kidney disease: Secondary | ICD-10-CM | POA: Diagnosis not present

## 2022-04-06 DIAGNOSIS — R32 Unspecified urinary incontinence: Secondary | ICD-10-CM | POA: Diagnosis not present

## 2022-04-06 DIAGNOSIS — E1122 Type 2 diabetes mellitus with diabetic chronic kidney disease: Secondary | ICD-10-CM | POA: Diagnosis not present

## 2022-04-06 DIAGNOSIS — I129 Hypertensive chronic kidney disease with stage 1 through stage 4 chronic kidney disease, or unspecified chronic kidney disease: Secondary | ICD-10-CM | POA: Diagnosis not present

## 2022-04-06 DIAGNOSIS — N184 Chronic kidney disease, stage 4 (severe): Secondary | ICD-10-CM | POA: Diagnosis not present

## 2022-04-06 DIAGNOSIS — N390 Urinary tract infection, site not specified: Secondary | ICD-10-CM | POA: Diagnosis not present

## 2022-04-06 DIAGNOSIS — J449 Chronic obstructive pulmonary disease, unspecified: Secondary | ICD-10-CM | POA: Diagnosis not present

## 2022-04-06 DIAGNOSIS — Z6841 Body Mass Index (BMI) 40.0 and over, adult: Secondary | ICD-10-CM | POA: Diagnosis not present

## 2022-04-07 DIAGNOSIS — E1122 Type 2 diabetes mellitus with diabetic chronic kidney disease: Secondary | ICD-10-CM | POA: Diagnosis not present

## 2022-04-07 DIAGNOSIS — I130 Hypertensive heart and chronic kidney disease with heart failure and stage 1 through stage 4 chronic kidney disease, or unspecified chronic kidney disease: Secondary | ICD-10-CM | POA: Diagnosis not present

## 2022-04-14 DIAGNOSIS — M25551 Pain in right hip: Secondary | ICD-10-CM | POA: Diagnosis not present

## 2022-04-14 DIAGNOSIS — M47816 Spondylosis without myelopathy or radiculopathy, lumbar region: Secondary | ICD-10-CM | POA: Diagnosis not present

## 2022-04-17 DIAGNOSIS — I5032 Chronic diastolic (congestive) heart failure: Secondary | ICD-10-CM | POA: Diagnosis not present

## 2022-04-17 DIAGNOSIS — M25551 Pain in right hip: Secondary | ICD-10-CM | POA: Diagnosis not present

## 2022-04-17 DIAGNOSIS — I10 Essential (primary) hypertension: Secondary | ICD-10-CM | POA: Diagnosis not present

## 2022-04-17 DIAGNOSIS — N184 Chronic kidney disease, stage 4 (severe): Secondary | ICD-10-CM | POA: Diagnosis not present

## 2022-04-17 DIAGNOSIS — J449 Chronic obstructive pulmonary disease, unspecified: Secondary | ICD-10-CM | POA: Diagnosis not present

## 2022-04-17 DIAGNOSIS — E1122 Type 2 diabetes mellitus with diabetic chronic kidney disease: Secondary | ICD-10-CM | POA: Diagnosis not present

## 2022-05-07 DIAGNOSIS — I130 Hypertensive heart and chronic kidney disease with heart failure and stage 1 through stage 4 chronic kidney disease, or unspecified chronic kidney disease: Secondary | ICD-10-CM | POA: Diagnosis not present

## 2022-05-07 DIAGNOSIS — E1122 Type 2 diabetes mellitus with diabetic chronic kidney disease: Secondary | ICD-10-CM | POA: Diagnosis not present

## 2022-05-27 DIAGNOSIS — I129 Hypertensive chronic kidney disease with stage 1 through stage 4 chronic kidney disease, or unspecified chronic kidney disease: Secondary | ICD-10-CM | POA: Diagnosis not present

## 2022-05-27 DIAGNOSIS — D631 Anemia in chronic kidney disease: Secondary | ICD-10-CM | POA: Diagnosis not present

## 2022-05-27 DIAGNOSIS — R32 Unspecified urinary incontinence: Secondary | ICD-10-CM | POA: Diagnosis not present

## 2022-05-27 DIAGNOSIS — N184 Chronic kidney disease, stage 4 (severe): Secondary | ICD-10-CM | POA: Diagnosis not present

## 2022-05-27 DIAGNOSIS — Z6841 Body Mass Index (BMI) 40.0 and over, adult: Secondary | ICD-10-CM | POA: Diagnosis not present

## 2022-05-27 DIAGNOSIS — E1122 Type 2 diabetes mellitus with diabetic chronic kidney disease: Secondary | ICD-10-CM | POA: Diagnosis not present

## 2022-05-27 DIAGNOSIS — N390 Urinary tract infection, site not specified: Secondary | ICD-10-CM | POA: Diagnosis not present

## 2022-05-27 DIAGNOSIS — J449 Chronic obstructive pulmonary disease, unspecified: Secondary | ICD-10-CM | POA: Diagnosis not present

## 2022-05-28 DIAGNOSIS — N184 Chronic kidney disease, stage 4 (severe): Secondary | ICD-10-CM | POA: Diagnosis not present

## 2022-05-28 DIAGNOSIS — I129 Hypertensive chronic kidney disease with stage 1 through stage 4 chronic kidney disease, or unspecified chronic kidney disease: Secondary | ICD-10-CM | POA: Diagnosis not present

## 2022-05-28 DIAGNOSIS — E1122 Type 2 diabetes mellitus with diabetic chronic kidney disease: Secondary | ICD-10-CM | POA: Diagnosis not present

## 2022-05-28 DIAGNOSIS — J449 Chronic obstructive pulmonary disease, unspecified: Secondary | ICD-10-CM | POA: Diagnosis not present

## 2022-06-28 DIAGNOSIS — I5032 Chronic diastolic (congestive) heart failure: Secondary | ICD-10-CM | POA: Diagnosis not present

## 2022-06-28 DIAGNOSIS — E1169 Type 2 diabetes mellitus with other specified complication: Secondary | ICD-10-CM | POA: Diagnosis not present

## 2022-06-28 DIAGNOSIS — E1122 Type 2 diabetes mellitus with diabetic chronic kidney disease: Secondary | ICD-10-CM | POA: Diagnosis not present

## 2022-06-28 DIAGNOSIS — E039 Hypothyroidism, unspecified: Secondary | ICD-10-CM | POA: Diagnosis not present

## 2022-06-28 DIAGNOSIS — I10 Essential (primary) hypertension: Secondary | ICD-10-CM | POA: Diagnosis not present

## 2022-06-28 DIAGNOSIS — N184 Chronic kidney disease, stage 4 (severe): Secondary | ICD-10-CM | POA: Diagnosis not present

## 2022-07-08 DIAGNOSIS — I130 Hypertensive heart and chronic kidney disease with heart failure and stage 1 through stage 4 chronic kidney disease, or unspecified chronic kidney disease: Secondary | ICD-10-CM | POA: Diagnosis not present

## 2022-07-08 DIAGNOSIS — I5032 Chronic diastolic (congestive) heart failure: Secondary | ICD-10-CM | POA: Diagnosis not present

## 2022-07-08 DIAGNOSIS — E1122 Type 2 diabetes mellitus with diabetic chronic kidney disease: Secondary | ICD-10-CM | POA: Diagnosis not present

## 2022-07-08 DIAGNOSIS — N183 Chronic kidney disease, stage 3 unspecified: Secondary | ICD-10-CM | POA: Diagnosis not present

## 2022-07-19 DIAGNOSIS — M79671 Pain in right foot: Secondary | ICD-10-CM | POA: Diagnosis not present

## 2022-07-19 DIAGNOSIS — I5032 Chronic diastolic (congestive) heart failure: Secondary | ICD-10-CM | POA: Diagnosis not present

## 2022-07-19 DIAGNOSIS — M722 Plantar fascial fibromatosis: Secondary | ICD-10-CM | POA: Diagnosis not present

## 2022-07-19 DIAGNOSIS — I110 Hypertensive heart disease with heart failure: Secondary | ICD-10-CM | POA: Diagnosis not present

## 2022-07-20 DIAGNOSIS — I129 Hypertensive chronic kidney disease with stage 1 through stage 4 chronic kidney disease, or unspecified chronic kidney disease: Secondary | ICD-10-CM | POA: Diagnosis not present

## 2022-07-20 DIAGNOSIS — J449 Chronic obstructive pulmonary disease, unspecified: Secondary | ICD-10-CM | POA: Diagnosis not present

## 2022-07-20 DIAGNOSIS — Z6841 Body Mass Index (BMI) 40.0 and over, adult: Secondary | ICD-10-CM | POA: Diagnosis not present

## 2022-07-20 DIAGNOSIS — N184 Chronic kidney disease, stage 4 (severe): Secondary | ICD-10-CM | POA: Diagnosis not present

## 2022-07-20 DIAGNOSIS — E1122 Type 2 diabetes mellitus with diabetic chronic kidney disease: Secondary | ICD-10-CM | POA: Diagnosis not present

## 2022-07-20 DIAGNOSIS — R32 Unspecified urinary incontinence: Secondary | ICD-10-CM | POA: Diagnosis not present

## 2022-07-20 DIAGNOSIS — D631 Anemia in chronic kidney disease: Secondary | ICD-10-CM | POA: Diagnosis not present

## 2022-07-29 ENCOUNTER — Ambulatory Visit (INDEPENDENT_AMBULATORY_CARE_PROVIDER_SITE_OTHER): Payer: Medicare HMO

## 2022-07-29 ENCOUNTER — Encounter: Payer: Self-pay | Admitting: Podiatry

## 2022-07-29 ENCOUNTER — Ambulatory Visit: Payer: Medicare HMO | Admitting: Podiatry

## 2022-07-29 DIAGNOSIS — M722 Plantar fascial fibromatosis: Secondary | ICD-10-CM

## 2022-07-29 DIAGNOSIS — M79671 Pain in right foot: Secondary | ICD-10-CM

## 2022-07-29 MED ORDER — MELOXICAM 7.5 MG PO TABS: 7.5000 mg | ORAL_TABLET | Freq: Every day | ORAL | 0 refills | Status: DC

## 2022-07-29 NOTE — Patient Instructions (Signed)

## 2022-07-29 NOTE — Progress Notes (Signed)
  Subjective:  Patient ID: Rebekah Ray, female    DOB: 1955/11/15,  MRN: [id number]  Chief Complaint  Patient presents with   Foot Pain    Right foot, heel pain x3 weeks    66 y.o. female presents with the above complaint.  Patient presents with complaint of right heel pain present for approximately 3 weeks.  She notes pain when she steps out of bed in the morning.  Pain is slightly relieved by anti-inflammatory medications and icing.  She has not been doing any stretching.  Denies having this issue in the past denies any recent injury.  Review of Systems: Negative except as noted in the HPI. Denies N/V/F/Ch.   Objective:  There were no vitals filed for this visit. There is no height or weight on file to calculate BMI. Constitutional Well developed. Well nourished.  Vascular Dorsalis pedis pulses palpable bilaterally. Posterior tibial pulses palpable bilaterally. Capillary refill normal to all digits.  No cyanosis or clubbing noted. Pedal hair growth normal.  Neurologic Normal speech. Oriented to person, place, and time. Epicritic sensation to light touch grossly present bilaterally.  Dermatologic Nails well groomed and normal in appearance. No open wounds. No skin lesions.  Orthopedic: Normal joint ROM without pain or crepitus bilaterally. No visible deformities. Tender to palpation at the calcaneal tuber right. No pain with calcaneal squeeze right. Ankle ROM diminished range of motion right. Silfverskiold Test: positive right.   Radiographs: Taken and reviewed. No acute fractures or dislocations. No evidence of stress fracture.  Plantar heel spur present. Posterior heel spur absent.   Assessment:   1. Plantar fasciitis, right   2. Foot pain, right    Plan:  Patient was evaluated and treated and all questions answered.  Plantar Fasciitis, right - XR reviewed as above.  - Educated on icing and stretching. Instructions given.  - Injection delivered to the plantar  fascia as below. - DME: Night splint and powerstep orthotics dispensed. - Pharmacologic management: Meloxicam 7.5 mg tablet daily x 30 days. Educated on risks/benefits and proper taking of medication.  Procedure: Injection Tendon/Ligament Location: Right plantar fascia at the glabrous junction; medial approach. Skin Prep: alcohol Injectate: 1 cc 0.5% marcaine plain, 1 cc kenalog 10. Disposition: Patient tolerated procedure well. Injection site dressed with a band-aid.  Return in about 4 weeks (around 08/26/2022) for follow up right plantar fasciitis.

## 2022-07-30 ENCOUNTER — Other Ambulatory Visit (HOSPITAL_COMMUNITY): Payer: Self-pay | Admitting: Family Medicine

## 2022-07-30 DIAGNOSIS — Z1231 Encounter for screening mammogram for malignant neoplasm of breast: Secondary | ICD-10-CM

## 2022-08-05 ENCOUNTER — Other Ambulatory Visit: Payer: Self-pay | Admitting: Podiatry

## 2022-08-05 DIAGNOSIS — M722 Plantar fascial fibromatosis: Secondary | ICD-10-CM

## 2022-08-07 DIAGNOSIS — N183 Chronic kidney disease, stage 3 unspecified: Secondary | ICD-10-CM | POA: Diagnosis not present

## 2022-08-07 DIAGNOSIS — I130 Hypertensive heart and chronic kidney disease with heart failure and stage 1 through stage 4 chronic kidney disease, or unspecified chronic kidney disease: Secondary | ICD-10-CM | POA: Diagnosis not present

## 2022-08-07 DIAGNOSIS — E1122 Type 2 diabetes mellitus with diabetic chronic kidney disease: Secondary | ICD-10-CM | POA: Diagnosis not present

## 2022-08-07 DIAGNOSIS — I5032 Chronic diastolic (congestive) heart failure: Secondary | ICD-10-CM | POA: Diagnosis not present

## 2022-08-09 DIAGNOSIS — E1122 Type 2 diabetes mellitus with diabetic chronic kidney disease: Secondary | ICD-10-CM | POA: Diagnosis not present

## 2022-08-09 DIAGNOSIS — I129 Hypertensive chronic kidney disease with stage 1 through stage 4 chronic kidney disease, or unspecified chronic kidney disease: Secondary | ICD-10-CM | POA: Diagnosis not present

## 2022-08-09 DIAGNOSIS — E1165 Type 2 diabetes mellitus with hyperglycemia: Secondary | ICD-10-CM | POA: Diagnosis not present

## 2022-08-09 DIAGNOSIS — E039 Hypothyroidism, unspecified: Secondary | ICD-10-CM | POA: Diagnosis not present

## 2022-08-09 DIAGNOSIS — M79671 Pain in right foot: Secondary | ICD-10-CM | POA: Diagnosis not present

## 2022-08-09 DIAGNOSIS — Z794 Long term (current) use of insulin: Secondary | ICD-10-CM | POA: Diagnosis not present

## 2022-08-09 DIAGNOSIS — M722 Plantar fascial fibromatosis: Secondary | ICD-10-CM | POA: Diagnosis not present

## 2022-08-09 DIAGNOSIS — N184 Chronic kidney disease, stage 4 (severe): Secondary | ICD-10-CM | POA: Diagnosis not present

## 2022-09-02 ENCOUNTER — Ambulatory Visit: Payer: Medicare HMO | Admitting: Podiatry

## 2022-09-02 DIAGNOSIS — M79671 Pain in right foot: Secondary | ICD-10-CM | POA: Diagnosis not present

## 2022-09-02 DIAGNOSIS — M722 Plantar fascial fibromatosis: Secondary | ICD-10-CM

## 2022-09-02 NOTE — Progress Notes (Signed)
  Subjective:  Patient ID: Rebekah Ray, female    DOB: 10-09-1956,  MRN: [id number]  Chief Complaint  Patient presents with   Foot Pain    Right heel pain, plantar fasciitis. Patient stated she is feeling a lot better and pain is occasionally. Patient is taking Tylenol as needed due to primary care provider requested patient to discontinue Meloxicam due to her kidney function of the patient. Patient is using night splint and powersteps.     66 y.o. female presents  for follow up right heel pain. She states she is doing better in regards to her right heel pain. States only painful occasionally. 80% improvement. Has been wearing orthotics and night splint, doing stretching and icing. Dcd meloxicam due to renal function.   Review of Systems: Negative except as noted in the HPI. Denies N/V/F/Ch.   Objective:  There were no vitals filed for this visit. There is no height or weight on file to calculate BMI. Constitutional Well developed. Well nourished.  Vascular Dorsalis pedis pulses palpable bilaterally. Posterior tibial pulses palpable bilaterally. Capillary refill normal to all digits.  No cyanosis or clubbing noted. Pedal hair growth normal.  Neurologic Normal speech. Oriented to person, place, and time. Epicritic sensation to light touch grossly present bilaterally.  Dermatologic Nails well groomed and normal in appearance. No open wounds. No skin lesions.  Orthopedic: Normal joint ROM without pain or crepitus bilaterally. No visible deformities. Non tender  to palpation at the calcaneal tuber right. No pain with calcaneal squeeze right. Ankle ROM diminished range of motion right. Silfverskiold Test: positive right.   Radiographs: Taken and reviewed. No acute fractures or dislocations. No evidence of stress fracture.  Plantar heel spur present. Posterior heel spur absent.   Assessment:   1. Plantar fasciitis, right   2. Foot pain, right     Plan:  Patient was evaluated  and treated and all questions answered.  Plantar Fasciitis, right, nearly fully resolved after 1st steroid injection, powerstep inserts and night splint. Only pain occasionally  - XR reviewed as above.  - Educated on icing and stretching. Continue these - Hold on further injection for now - Continue Night splint and powerstep orthotics  use - Pharmacologic management: Tylenol PRn  Return in about 3 months (around 12/03/2022) for Plantar fasciitis R foot.        Everitt Amber, DPM [facility]                   09/02/2022

## 2022-09-03 ENCOUNTER — Ambulatory Visit (HOSPITAL_COMMUNITY): Payer: Medicare HMO

## 2022-09-07 DIAGNOSIS — N183 Chronic kidney disease, stage 3 unspecified: Secondary | ICD-10-CM | POA: Diagnosis not present

## 2022-09-07 DIAGNOSIS — I130 Hypertensive heart and chronic kidney disease with heart failure and stage 1 through stage 4 chronic kidney disease, or unspecified chronic kidney disease: Secondary | ICD-10-CM | POA: Diagnosis not present

## 2022-09-07 DIAGNOSIS — I5032 Chronic diastolic (congestive) heart failure: Secondary | ICD-10-CM | POA: Diagnosis not present

## 2022-09-07 DIAGNOSIS — E1122 Type 2 diabetes mellitus with diabetic chronic kidney disease: Secondary | ICD-10-CM | POA: Diagnosis not present

## 2022-09-09 ENCOUNTER — Ambulatory Visit: Payer: Medicare HMO | Admitting: Podiatry

## 2022-09-09 DIAGNOSIS — L600 Ingrowing nail: Secondary | ICD-10-CM | POA: Diagnosis not present

## 2022-09-09 DIAGNOSIS — M79671 Pain in right foot: Secondary | ICD-10-CM

## 2022-09-09 DIAGNOSIS — M722 Plantar fascial fibromatosis: Secondary | ICD-10-CM

## 2022-09-09 NOTE — Patient Instructions (Signed)

## 2022-09-09 NOTE — Progress Notes (Signed)
Subjective:  Patient ID: Rebekah Ray, female    DOB: 1955/11/28,  MRN: [id number]  Chief Complaint  Patient presents with   Foot Pain    Right heel pain, injection   Ingrown Toenail    Ingrown to right hallux. Soreness. Denies any pus or bleeding. Redness noted.     66 y.o. female presents with the above complaint.  Patient complains of pain in the right heel.  She has previously had an injection in that area and initially it seemed to resolve all of her pain but she is not having some pain again in the right heel.  Additionally she has some pain at the nail borders of the right great toe.  She denies any purulence or bleeding but does have pain with the area with any pressure on it from shoes.   Review of Systems: Negative except as noted in the HPI. Denies N/V/F/Ch.   Objective:  There were no vitals filed for this visit. There is no height or weight on file to calculate BMI. Constitutional Well developed. Well nourished.  Vascular Dorsalis pedis pulses palpable bilaterally. Posterior tibial pulses palpable bilaterally. Capillary refill normal to all digits.  No cyanosis or clubbing noted. Pedal hair growth normal.  Neurologic Normal speech. Oriented to person, place, and time. Epicritic sensation to light touch grossly present bilaterally.  Dermatologic Incurvation is present along the lateral nail border of the right great toe. There is localized edema without any erythema or increase in warmth around the nail border. There is no drainage or pus. There is no ascending cellulitis. No malodor. No open lesions or pre-ulcerative lesions.    Orthopedic: Normal joint ROM without pain or crepitus bilaterally. No visible deformities. Tender to palpation at the calcaneal tuber right. No pain with calcaneal squeeze right. Ankle ROM diminished range of motion right. Silfverskiold Test: negative right.    Assessment:   1. Plantar fasciitis, right   2. Ingrown nail of great toe  of right foot   3. Foot pain, right    Plan:  Patient was evaluated and treated and all questions answered.  Plantar Fasciitis, right - XR reviewed as above.  - Educated on icing and stretching. Instructions given.  - Injection delivered to the plantar fascia as below. - Pharmacologic management: Meloxicam. Educated on risks/benefits and proper taking of medication.  Procedure: Injection Tendon/Ligament Location: Right plantar fascia at the glabrous junction; medial approach. Skin Prep: alcohol Injectate: 1 cc 0.5% marcaine plain, 1 cc kenalog 10. Disposition: Patient tolerated procedure well. Injection site dressed with a band-aid.    Ingrown Nail, right hallux lateral border -Patient elects to proceed with minor surgery to remove ingrown toenail today. Consent reviewed and signed by patient. -Ingrown nail excised. See procedure note. -Educated on post-procedure care including soaking. Written instructions provided and reviewed. -Patient to follow up in 2 weeks for nail check.  Procedure: Excision of Ingrown Toenail Location: Right 1st toe lateral nail borders. Anesthesia: Lidocaine 1% plain; 1.5 mL and Marcaine 0.5% plain; 1.5 mL, digital block. Skin Prep: Betadine. Dressing: Silvadene; telfa; dry, sterile, compression dressing. Technique: Following skin prep, the toe was exsanguinated and a tourniquet was secured at the base of the toe. The affected nail border was freed, split with a nail splitter, and excised. Chemical matrixectomy was then performed with phenol and irrigated out with alcohol. The tourniquet was then removed and sterile dressing applied. Disposition: Patient tolerated procedure well. Patient to return in 2 weeks for follow-up.    Return  in about 2 weeks (around 09/23/2022) for Right hallux ingrown nail removal.

## 2022-09-13 DIAGNOSIS — I10 Essential (primary) hypertension: Secondary | ICD-10-CM | POA: Diagnosis not present

## 2022-09-13 DIAGNOSIS — I5032 Chronic diastolic (congestive) heart failure: Secondary | ICD-10-CM | POA: Diagnosis not present

## 2022-09-13 DIAGNOSIS — E1122 Type 2 diabetes mellitus with diabetic chronic kidney disease: Secondary | ICD-10-CM | POA: Diagnosis not present

## 2022-09-13 DIAGNOSIS — I130 Hypertensive heart and chronic kidney disease with heart failure and stage 1 through stage 4 chronic kidney disease, or unspecified chronic kidney disease: Secondary | ICD-10-CM | POA: Diagnosis not present

## 2022-09-13 DIAGNOSIS — M722 Plantar fascial fibromatosis: Secondary | ICD-10-CM | POA: Diagnosis not present

## 2022-09-13 DIAGNOSIS — N184 Chronic kidney disease, stage 4 (severe): Secondary | ICD-10-CM | POA: Diagnosis not present

## 2022-09-17 ENCOUNTER — Ambulatory Visit (HOSPITAL_COMMUNITY)
Admission: RE | Admit: 2022-09-17 | Discharge: 2022-09-17 | Disposition: A | Discharge status: Home / Self Care | Payer: Medicare HMO | Source: Ambulatory Visit | Attending: Family Medicine | Admitting: Family Medicine

## 2022-09-17 DIAGNOSIS — Z1231 Encounter for screening mammogram for malignant neoplasm of breast: Secondary | ICD-10-CM | POA: Diagnosis not present

## 2022-09-22 DIAGNOSIS — E1122 Type 2 diabetes mellitus with diabetic chronic kidney disease: Secondary | ICD-10-CM | POA: Diagnosis not present

## 2022-09-22 DIAGNOSIS — N184 Chronic kidney disease, stage 4 (severe): Secondary | ICD-10-CM | POA: Diagnosis not present

## 2022-09-22 DIAGNOSIS — Z0000 Encounter for general adult medical examination without abnormal findings: Secondary | ICD-10-CM | POA: Diagnosis not present

## 2022-09-23 ENCOUNTER — Ambulatory Visit: Payer: Medicare HMO | Admitting: Podiatry

## 2022-09-23 DIAGNOSIS — M76821 Posterior tibial tendinitis, right leg: Secondary | ICD-10-CM

## 2022-09-23 DIAGNOSIS — M79671 Pain in right foot: Secondary | ICD-10-CM

## 2022-09-23 DIAGNOSIS — L600 Ingrowing nail: Secondary | ICD-10-CM | POA: Diagnosis not present

## 2022-09-23 NOTE — Progress Notes (Signed)
Subjective: Rebekah Ray is a 66 y.o.  female returns to office today for follow up evaluation after having right Hallux lateral border nail ingrown removal with phenol and alcohol matrixectomy approximately 2 weeks ago. Patient has been soaking using epsom salts and applying topical antibiotic covered with bandaid daily. Patient denies fevers, chills, nausea, vomiting. Denies any calf pain, chest pain, SOB.  Patient does report also some chronic right ankle pain and instability interested in possible brace as she is on her feet all day at work and cuts back and forth which causes her pain.  Objective:  Vitals: Reviewed  General: Well developed, nourished, in no acute distress, alert and oriented x3   Dermatology: Skin is warm, dry and supple bilateral. right hallux nail border appears to be clean, dry, with mild granular tissue and surrounding scab. There is no surrounding erythema, edema, drainage/purulence. The remaining nails appear unremarkable at this time. There are no other lesions or other signs of infection present.  Neurovascular status: Intact. No lower extremity swelling; No pain with calf compression bilateral.  Musculoskeletal: Decreased tenderness to palpation of the right hallux nail fold(s). Muscular strength within normal limits bilateral.  Pain along the course of the posterior tibial tendon on the right ankle.  Mild edema at this area.  Assesement and Plan: #S/p phenol and alcohol matrixectomy to the  right hallux nail lateral border, doing well.   -Continue soaking in epsom salts twice a day followed by antibiotic ointment and a band-aid. Can leave uncovered at night. Continue this until completely healed.  -If the area has not healed in 2 weeks, call the office for follow-up appointment, or sooner if any problems arise.  -Monitor for any signs/symptoms of infection. Call the office immediately if any occur or go directly to the emergency room. Call with any  questions/concerns.  #Chronic right ankle pain and pain along the posterior tibial tendon -Recommend we proceed with a Tri-Lock ankle brace for right ankle pain especially pain medially along the course of the PT tendon. -Tri-Lock ankle brace dispensed to the patient -Patient does work in a job where she has to turn and cut back and forth a lot on her feet believe that a Tri-Lock brace will decrease stress on the medial and lateral ankle and working. -Recommend continued use of anti-inflammatory medications as able.  #Planter fasciitis right -Doing well after prior steroid injection at last visit currently no pain in the right heel.  Continue with icing stretching insert use.        Everitt Amber, [place] / [facility]                   09/23/2022

## 2022-10-04 DIAGNOSIS — I130 Hypertensive heart and chronic kidney disease with heart failure and stage 1 through stage 4 chronic kidney disease, or unspecified chronic kidney disease: Secondary | ICD-10-CM | POA: Diagnosis not present

## 2022-10-04 DIAGNOSIS — E1122 Type 2 diabetes mellitus with diabetic chronic kidney disease: Secondary | ICD-10-CM | POA: Diagnosis not present

## 2022-10-04 DIAGNOSIS — Z794 Long term (current) use of insulin: Secondary | ICD-10-CM | POA: Diagnosis not present

## 2022-10-04 DIAGNOSIS — I5032 Chronic diastolic (congestive) heart failure: Secondary | ICD-10-CM | POA: Diagnosis not present

## 2022-10-04 DIAGNOSIS — N184 Chronic kidney disease, stage 4 (severe): Secondary | ICD-10-CM | POA: Diagnosis not present

## 2022-10-19 DIAGNOSIS — E1122 Type 2 diabetes mellitus with diabetic chronic kidney disease: Secondary | ICD-10-CM | POA: Diagnosis not present

## 2022-10-19 DIAGNOSIS — N184 Chronic kidney disease, stage 4 (severe): Secondary | ICD-10-CM | POA: Diagnosis not present

## 2022-10-19 DIAGNOSIS — J449 Chronic obstructive pulmonary disease, unspecified: Secondary | ICD-10-CM | POA: Diagnosis not present

## 2022-10-19 DIAGNOSIS — I129 Hypertensive chronic kidney disease with stage 1 through stage 4 chronic kidney disease, or unspecified chronic kidney disease: Secondary | ICD-10-CM | POA: Diagnosis not present

## 2022-10-19 DIAGNOSIS — D631 Anemia in chronic kidney disease: Secondary | ICD-10-CM | POA: Diagnosis not present

## 2022-10-25 DIAGNOSIS — I5022 Chronic systolic (congestive) heart failure: Secondary | ICD-10-CM | POA: Diagnosis not present

## 2022-10-25 DIAGNOSIS — I130 Hypertensive heart and chronic kidney disease with heart failure and stage 1 through stage 4 chronic kidney disease, or unspecified chronic kidney disease: Secondary | ICD-10-CM | POA: Diagnosis not present

## 2022-10-25 DIAGNOSIS — E785 Hyperlipidemia, unspecified: Secondary | ICD-10-CM | POA: Diagnosis not present

## 2022-10-25 DIAGNOSIS — N184 Chronic kidney disease, stage 4 (severe): Secondary | ICD-10-CM | POA: Diagnosis not present

## 2022-10-25 DIAGNOSIS — E1122 Type 2 diabetes mellitus with diabetic chronic kidney disease: Secondary | ICD-10-CM | POA: Diagnosis not present

## 2022-10-25 DIAGNOSIS — Z794 Long term (current) use of insulin: Secondary | ICD-10-CM | POA: Diagnosis not present

## 2022-11-04 ENCOUNTER — Encounter (HOSPITAL_COMMUNITY): Payer: Self-pay

## 2022-11-04 ENCOUNTER — Emergency Department (HOSPITAL_COMMUNITY)
Admission: EM | Admit: 2022-11-04 | Discharge: 2022-11-04 | Disposition: A | Discharge status: Home / Self Care | Payer: No Typology Code available for payment source | Attending: Emergency Medicine | Admitting: Emergency Medicine

## 2022-11-04 ENCOUNTER — Other Ambulatory Visit: Payer: Self-pay

## 2022-11-04 ENCOUNTER — Ambulatory Visit: Payer: Medicare HMO | Admitting: Podiatry

## 2022-11-04 DIAGNOSIS — I10 Essential (primary) hypertension: Secondary | ICD-10-CM | POA: Diagnosis not present

## 2022-11-04 DIAGNOSIS — Z79899 Other long term (current) drug therapy: Secondary | ICD-10-CM | POA: Insufficient documentation

## 2022-11-04 DIAGNOSIS — Z794 Long term (current) use of insulin: Secondary | ICD-10-CM | POA: Insufficient documentation

## 2022-11-04 DIAGNOSIS — L600 Ingrowing nail: Secondary | ICD-10-CM

## 2022-11-04 DIAGNOSIS — J449 Chronic obstructive pulmonary disease, unspecified: Secondary | ICD-10-CM | POA: Insufficient documentation

## 2022-11-04 DIAGNOSIS — Z23 Encounter for immunization: Secondary | ICD-10-CM | POA: Diagnosis not present

## 2022-11-04 DIAGNOSIS — W208XXA Other cause of strike by thrown, projected or falling object, initial encounter: Secondary | ICD-10-CM | POA: Insufficient documentation

## 2022-11-04 DIAGNOSIS — M76821 Posterior tibial tendinitis, right leg: Secondary | ICD-10-CM | POA: Diagnosis not present

## 2022-11-04 DIAGNOSIS — Z7984 Long term (current) use of oral hypoglycemic drugs: Secondary | ICD-10-CM | POA: Insufficient documentation

## 2022-11-04 DIAGNOSIS — E119 Type 2 diabetes mellitus without complications: Secondary | ICD-10-CM | POA: Diagnosis not present

## 2022-11-04 DIAGNOSIS — S61412A Laceration without foreign body of left hand, initial encounter: Secondary | ICD-10-CM | POA: Diagnosis not present

## 2022-11-04 DIAGNOSIS — Z7982 Long term (current) use of aspirin: Secondary | ICD-10-CM | POA: Insufficient documentation

## 2022-11-04 MED ORDER — CEPHALEXIN 500 MG PO CAPS: 500.0000 mg | ORAL_CAPSULE | Freq: Two times a day (BID) | ORAL | 0 refills | Status: AC

## 2022-11-04 MED ORDER — CEPHALEXIN 500 MG PO CAPS
500.0000 mg | ORAL_CAPSULE | Freq: Once | ORAL | Status: AC
  Administered 2022-11-04: 500 mg via ORAL
  Filled 2022-11-04: qty 1

## 2022-11-04 MED ORDER — TETANUS-DIPHTH-ACELL PERTUSSIS 5-2.5-18.5 LF-MCG/0.5 IM SUSY
0.5000 mL | PREFILLED_SYRINGE | Freq: Once | INTRAMUSCULAR | Status: AC
Start: 2022-11-04 — End: 2022-11-04
  Administered 2022-11-04: 0.5 mL via INTRAMUSCULAR
  Filled 2022-11-04: qty 0.5

## 2022-11-04 NOTE — Progress Notes (Signed)
Subjective:  Patient ID: Rebekah Ray, female    DOB: 1956-03-20,  MRN: [id number]  Chief Complaint  Patient presents with   Follow-up    Right ankle PTTD, Plantar Fasciitis    Ingrown Toenail    Ingrown procedure done to right hallux Nov 2023. Patient is complaining of soreness and bleeding to left hallux.     66 y.o. female presents for concern with bilateral hallux nail pain.  She says the medial border of the right hallux is now giving her some residual pain and tenderness as well as being swollen and red.  She previously had the right lateral hallux border removed in November of this year.  She is also having some pain in the left hallux on the lateral border and has noticed some bleeding from that side of the nail.  In regards to the right ankle pain and plantar fascia she is feels she is much improved she has not been using the brace but has been not having nearly as much pain as she did previously.  Past Medical History:  Diagnosis Date   COPD (chronic obstructive pulmonary disease) ([place])    Diabetes mellitus without complication (HCC)    Hyperlipidemia    Hypertension    Renal disorder    Thyroid disease     Allergies  Allergen Reactions   Benadryl [Diphenhydramine]     Makes heart race   Norvasc [Amlodipine Besylate]     edema    ROS: Negative except as per HPI above  Objective:  General: AAO x3, NAD  Dermatological: Incurvation is present along the lateral nail border of the left great toe and the medial border of the right great toe. There is localized edema without any erythema or increase in warmth around the nail border. There is no drainage or pus. There is no ascending cellulitis. No malodor. No open lesions or pre-ulcerative lesions.   Vascular:  Dorsalis Pedis artery and Posterior Tibial artery pedal pulses are 2/4 bilateral.  Capillary fill time < 3 sec to all digits.   Neruologic: Grossly intact via light touch bilateral. Protective threshold intact to  all sites bilateral.   Musculoskeletal: No gross boney pedal deformities bilateral. No pain, crepitus, or limitation noted with foot and ankle range of motion bilateral. Muscular strength 5/5 in all groups tested bilateral.  Gait: Unassisted, Nonantalgic.   No images are attached to the encounter.   Assessment:   1. Ingrown nail of great toe of left foot   2. Ingrown nail of great toe of right foot   3. Posterior tibial tendon dysfunction (PTTD) of right lower extremity      Plan:  Patient was evaluated and treated and all questions answered.    Ingrown Nail, bilaterally -right hallux medial and left hallux lateral -Patient elects to proceed with minor surgery to remove ingrown toenail today. Consent reviewed and signed by patient. -Ingrown nail excised. See procedure note. -Educated on post-procedure care including soaking. Written instructions provided and reviewed. -Patient to follow up in 2 weeks for nail check.  Procedure: Excision of Ingrown Toenail Location: Bilateral 1st toe  right medial and left lateral  nail borders. Anesthesia: Lidocaine 1% plain; 1.5 mL and Marcaine 0.5% plain; 1.5 mL, digital block. Skin Prep: Betadine. Dressing: Silvadene; telfa; dry, sterile, compression dressing. Technique: Following skin prep, the toe was exsanguinated and a tourniquet was secured at the base of the toe. The affected nail border was freed, split with a nail splitter, and excised. Chemical matrixectomy  was then performed with phenol and irrigated out with alcohol. The tourniquet was then removed and sterile dressing applied. Disposition: Patient tolerated procedure well. Patient to return in 2 weeks for follow-up.   # PTTD right ankle and plantar fasciitis -Resolved at this time with conservative measures continue to monitor for worsening of symptoms.   Return in about 2 weeks (around 11/18/2022) for follow up L lateral, R medial hallux border ingrown removal.          Everitt Amber, DPM [facility]

## 2022-11-04 NOTE — ED Provider Notes (Signed)
[facility] EMERGENCY DEPARTMENT Provider Note   CSN: [id number] Arrival date & time: 11/04/22  4259     History  Chief Complaint  Patient presents with   Extremity Laceration    Rebekah Ray is a 66 y.o. female with history of HTN, diabetes, COPD, HLD, and thyroid disease who presents to the emergency department complaining of a left hand laceration sustained at work. Happened around 5:30 am when patient was using a tea urn at work, the metal urn fell on her left hand, cutting hand. Has had difficulty controlling the bleeding. Last tetanus > 10 years ago. Patient is right hand dominant.   HPI     Home Medications Prior to Admission medications   Medication Sig Start Date End Date Taking? Authorizing Provider  cephALEXin (KEFLEX) 500 MG capsule Take 1 capsule (500 mg total) by mouth 2 (two) times daily for 5 days. 11/04/22 11/09/22 Yes Lakeem Rozo T, PA-C  acetaminophen-codeine (TYLENOL #3) 300-30 MG tablet Take 1 tablet by mouth every 6 (six) hours as needed. 07/19/22   [provider]  albuterol (PROVENTIL HFA;VENTOLIN HFA) 108 (90 BASE) MCG/ACT inhaler Inhale 2 puffs into the lungs every 6 (six) hours as needed for wheezing or shortness of breath. Patient taking differently: Inhale 2 puffs into the lungs as needed for wheezing or shortness of breath. 01/18/14   Dhungel, Flonnie Overman, MD  aspirin EC 81 MG tablet Take 81 mg by mouth daily.    [provider]  atorvastatin (LIPITOR) 40 MG tablet Take 1 tablet (40 mg total) by mouth daily. Keep upcoming appointment for future refills. 12/14/21   O'NealCassie Freer, MD  carvedilol (COREG) 25 MG tablet Take 1 tablet (25 mg total) by mouth 2 (two) times daily with a meal. 11/30/21   O'Neal, Cassie Freer, MD  fenofibrate (TRICOR) 48 MG tablet Take 48 mg by mouth daily. 09/25/18   [provider]  ferrous sulfate 325 (65 FE) MG tablet Take 162.5 mg by mouth 2 (two) times a week.    [provider]   furosemide (LASIX) 40 MG tablet Take 40 mg by mouth 2 (two) times daily. 12/22/21   [provider]  gabapentin (NEURONTIN) 100 MG capsule Take 200 mg by mouth 2 (two) times daily. 11/12/20   [provider]  hydrALAZINE (APRESOLINE) 50 MG tablet Take 1 tablet (50 mg total) by mouth every 8 (eight) hours. Patient taking differently: Take 100 mg by mouth in the morning and at bedtime. 01/18/14   Dhungel, Flonnie Overman, MD  HYDROcodone-acetaminophen (NORCO/VICODIN) 5-325 MG tablet Take one tab po q 4 hrs prn pain 01/21/22   Triplett, Tammy, PA-C  insulin glargine (LANTUS) 100 UNIT/ML injection Inject 20-48 Units into the skin in the morning. Self adjusts    [provider]  levothyroxine (SYNTHROID, LEVOTHROID) 50 MCG tablet Take 50 mcg by mouth daily before breakfast. 02/13/14   [provider]  meloxicam (MOBIC) 7.5 MG tablet Take 1 tablet (7.5 mg total) by mouth daily. 07/29/22   Standiford, Nena Alexander, DPM  Multiple Vitamin (MULTIVITAMIN) tablet Take 1 tablet by mouth daily.    [provider]  Multiple Vitamins-Minerals (MULTIVITAMIN WITH MINERALS) tablet Take 1 tablet by mouth daily.    [provider]  sitaGLIPtin (JANUVIA) 25 MG tablet Take 25 mg by mouth daily.    [provider]  spironolactone (ALDACTONE) 25 MG tablet Take 25 mg by mouth daily. 12/22/21   [provider]      Allergies  Benadryl [diphenhydramine] and Norvasc [amlodipine besylate]    Review of Systems   Review of Systems  Skin:  Positive for wound.  All other systems reviewed and are negative.   Physical Exam Updated Vital Signs BP (!) 138/90   Pulse 72   Temp 98 F (36.7 C) (Oral)   Resp 18   Ht '4\' 11"'$  (1.499 m)   Wt 77.1 kg   SpO2 96%   BMI 34.34 kg/m  Physical Exam Vitals and nursing note reviewed.  Constitutional:      Appearance: Normal appearance.  HENT:     Head: Normocephalic and atraumatic.  Eyes:     Conjunctiva/sclera:  Conjunctivae normal.  Pulmonary:     Effort: Pulmonary effort is normal. No respiratory distress.  Musculoskeletal:     Right hand: Normal.     Comments: Superficial laceration noted to the dorsum of the left hand, approximately 2 cm in length. No foreign bodies visualized. Approximates with minimal manipulation, not under tension.   Skin:    General: Skin is warm and dry.  Neurological:     Mental Status: She is alert.  Psychiatric:        Mood and Affect: Mood normal.        Behavior: Behavior normal.     ED Results / Procedures / Treatments   Labs (all labs ordered are listed, but only abnormal results are displayed) Labs Reviewed - No data to display  EKG None  Radiology No results found.  Procedures .Marland KitchenLaceration Repair  Date/Time: 11/04/2022 11:41 AM  Performed by: Kateri Plummer, PA-C Authorized by: Kateri Plummer, PA-C   Consent:    Consent obtained:  Verbal   Consent given by:  Patient   Risks, benefits, and alternatives were discussed: yes     Risks discussed:  Infection, pain, poor cosmetic result, poor wound healing and need for additional repair Universal protocol:    Patient identity confirmed:  Provided demographic data Anesthesia:    Anesthesia method:  None Laceration details:    Location:  Hand   Hand location:  L hand, dorsum   Length (cm):  2   Depth (mm):  0.5 Exploration:    Wound exploration: entire depth of wound visualized   Treatment:    Area cleansed with:  Soap and water   Amount of cleaning:  Standard   Visualized foreign bodies/material removed: no     Debridement:  None   Undermining:  None Skin repair:    Repair method:  Tissue adhesive Approximation:    Approximation:  Close Repair type:    Repair type:  Simple Post-procedure details:    Dressing:  Adhesive bandage   Procedure completion:  Tolerated well, no immediate complications     Medications Ordered in ED Medications  cephALEXin (KEFLEX) capsule 500 mg  (has no administration in time range)  Tdap (BOOSTRIX) injection 0.5 mL (0.5 mLs Intramuscular Given 11/04/22 1123)    ED Course/ Medical Decision Making/ A&P                           Medical Decision Making Risk Prescription drug management.  Patient is a 66 year old female who presents to the emergency department with concern for L hand laceration. Wound occurred about 4 hours prior to ER arrival.   Physical exam: Superficial laceration to dorsum of left hand, 2 cm in length. Under low tension.  Procedure: Wound explored and base of wound visualized in  a bloodless field without evidence of foreign body. Laceration was and closed with dermabond. Patient tolerated procedure well with no immediate complications. Tdap was updated.   Disposition: Patient has hx of diabetes, affecting normal wound healing. Patient discharged with antibiotics.  Confirmed dosage of ABX safe for patient with history of decreased renal function. Discussed suture home care with patient and answered questions. Patient to follow-up with PCP for wound check in a week; they are to return to the ED sooner for signs of infection. Pt is hemodynamically stable with no complaints prior to discharge.  Final Clinical Impression(s) / ED Diagnoses Final diagnoses:  Laceration of left hand without foreign body, initial encounter    Rx / DC Orders ED Discharge Orders          Ordered    cephALEXin (KEFLEX) 500 MG capsule  2 times daily        11/04/22 1139           Portions of this report may have been transcribed using voice recognition software. Every effort was made to ensure accuracy; however, inadvertent computerized transcription errors may be present.    Estill Cotta 11/04/22 1142    Sherwood Gambler, MD 11/04/22 1521

## 2022-11-04 NOTE — Patient Instructions (Signed)

## 2022-11-04 NOTE — ED Notes (Signed)
Pt cleaned hand with soap and water. Dried well. Pa aware. Dermabond at bedside

## 2022-11-04 NOTE — Discharge Instructions (Addendum)
You were seen in the emergency department for a hand laceration.   We were able to close your laceration with skin glue. The adhesive should peel off in about 5 to 10 days.  If it comes off sooner that is okay.  If it lasts longer than that, you can use some Vaseline to help it come off on its own.  With the glue, you may shower and wash your hands, but do not soak or scrub the area for 7 to 10 days.  Then make sure that you pat the area dry.   If you want to wear a bandage over the area that is fine as well, but make sure it is clean/dry (has no ointment on it).  While at work and wearing gloves, you can wear a normal bandaid over the area to preserve the glue.   I've prescribed you an antibiotic to prevent infection of the site.   Watch out for signs of infection like we discussed, including: increased redness, tenderness, or drainage of pus from the site. If this happens please seek medical attention.   You can take over the counter pain medicine like ibuprofen or tylenol as needed.

## 2022-11-04 NOTE — ED Triage Notes (Signed)
Patient states a metal tea urn fell on her left hand, causing laceration. Patient has bleeding controled in triage with gauze. Unknown last tetanus.

## 2022-11-06 DIAGNOSIS — N183 Chronic kidney disease, stage 3 unspecified: Secondary | ICD-10-CM | POA: Diagnosis not present

## 2022-11-06 DIAGNOSIS — E1122 Type 2 diabetes mellitus with diabetic chronic kidney disease: Secondary | ICD-10-CM | POA: Diagnosis not present

## 2022-11-06 DIAGNOSIS — I5032 Chronic diastolic (congestive) heart failure: Secondary | ICD-10-CM | POA: Diagnosis not present

## 2022-11-06 DIAGNOSIS — I130 Hypertensive heart and chronic kidney disease with heart failure and stage 1 through stage 4 chronic kidney disease, or unspecified chronic kidney disease: Secondary | ICD-10-CM | POA: Diagnosis not present

## 2022-11-18 ENCOUNTER — Ambulatory Visit: Payer: Medicare HMO | Admitting: Podiatry

## 2022-11-18 ENCOUNTER — Encounter: Payer: Self-pay | Admitting: Podiatry

## 2022-11-18 VITALS — BP 119/65 | HR 80

## 2022-11-18 DIAGNOSIS — L600 Ingrowing nail: Secondary | ICD-10-CM

## 2022-11-18 NOTE — Progress Notes (Signed)
Subjective: Rebekah Ray is a 67 y.o.  female returns to office today for follow up evaluation after having right Hallux medial and left hallux lateral border nail ingrown removal with phenol and alcohol matrixectomy approximately 2 weeks ago. Patient has been soaking using epsom salts and applying topical antibiotic covered with bandaid daily. Patient denies fevers, chills, nausea, vomiting. Denies any calf pain, chest pain, SOB.   Objective:  Vitals: Reviewed  General: Well developed, nourished, in no acute distress, alert and oriented x3   Dermatology: Skin is warm, dry and supple bilateral. Right medial and left lateral hallux nail border appears to be clean, dry, with mild granular tissue and surrounding scab. There is no surrounding erythema, edema, drainage/purulence. The remaining nails appear unremarkable at this time. There are no other lesions or other signs of infection present.  Neurovascular status: Intact. No lower extremity swelling; No pain with calf compression bilateral.  Musculoskeletal: Decreased tenderness to palpation of the right and left hallux nail fold(s). Muscular strength within normal limits bilateral.   Assesement and Plan: S/p phenol and alcohol matrixectomy to the  Bilateral 1st toe  right medial and left lateral  nail borders.   -Continue soaking in epsom salts twice a day followed by antibiotic ointment and a band-aid. Can leave uncovered at night. Continue this until completely healed.  -If the area has not healed in 2 weeks, call the office for follow-up appointment, or sooner if any problems arise.  -Monitor for any signs/symptoms of infection. Call the office immediately if any occur or go directly to the emergency room. Call with any questions/concerns.        Everitt Amber, [place] / [facility]                   11/18/2022

## 2022-11-20 DIAGNOSIS — M79642 Pain in left hand: Secondary | ICD-10-CM | POA: Diagnosis not present

## 2022-11-20 DIAGNOSIS — S61422A Laceration with foreign body of left hand, initial encounter: Secondary | ICD-10-CM | POA: Diagnosis not present

## 2022-12-07 DIAGNOSIS — Z6837 Body mass index (BMI) 37.0-37.9, adult: Secondary | ICD-10-CM | POA: Diagnosis not present

## 2022-12-07 DIAGNOSIS — N184 Chronic kidney disease, stage 4 (severe): Secondary | ICD-10-CM | POA: Diagnosis not present

## 2022-12-07 DIAGNOSIS — N183 Chronic kidney disease, stage 3 unspecified: Secondary | ICD-10-CM | POA: Diagnosis not present

## 2022-12-07 DIAGNOSIS — I130 Hypertensive heart and chronic kidney disease with heart failure and stage 1 through stage 4 chronic kidney disease, or unspecified chronic kidney disease: Secondary | ICD-10-CM | POA: Diagnosis not present

## 2022-12-07 DIAGNOSIS — I5032 Chronic diastolic (congestive) heart failure: Secondary | ICD-10-CM | POA: Diagnosis not present

## 2022-12-07 DIAGNOSIS — E1122 Type 2 diabetes mellitus with diabetic chronic kidney disease: Secondary | ICD-10-CM | POA: Diagnosis not present

## 2022-12-07 DIAGNOSIS — I10 Essential (primary) hypertension: Secondary | ICD-10-CM | POA: Diagnosis not present

## 2022-12-07 DIAGNOSIS — E785 Hyperlipidemia, unspecified: Secondary | ICD-10-CM | POA: Diagnosis not present

## 2022-12-16 ENCOUNTER — Ambulatory Visit: Payer: Medicare HMO | Admitting: Podiatry

## 2023-02-02 DIAGNOSIS — N390 Urinary tract infection, site not specified: Secondary | ICD-10-CM | POA: Diagnosis not present

## 2023-02-02 DIAGNOSIS — J449 Chronic obstructive pulmonary disease, unspecified: Secondary | ICD-10-CM | POA: Diagnosis not present

## 2023-02-02 DIAGNOSIS — Z6841 Body Mass Index (BMI) 40.0 and over, adult: Secondary | ICD-10-CM | POA: Diagnosis not present

## 2023-02-02 DIAGNOSIS — E1122 Type 2 diabetes mellitus with diabetic chronic kidney disease: Secondary | ICD-10-CM | POA: Diagnosis not present

## 2023-02-02 DIAGNOSIS — I129 Hypertensive chronic kidney disease with stage 1 through stage 4 chronic kidney disease, or unspecified chronic kidney disease: Secondary | ICD-10-CM | POA: Diagnosis not present

## 2023-02-02 DIAGNOSIS — D631 Anemia in chronic kidney disease: Secondary | ICD-10-CM | POA: Diagnosis not present

## 2023-02-02 DIAGNOSIS — N184 Chronic kidney disease, stage 4 (severe): Secondary | ICD-10-CM | POA: Diagnosis not present

## 2023-02-16 DIAGNOSIS — N184 Chronic kidney disease, stage 4 (severe): Secondary | ICD-10-CM | POA: Diagnosis not present

## 2023-02-16 DIAGNOSIS — E785 Hyperlipidemia, unspecified: Secondary | ICD-10-CM | POA: Diagnosis not present

## 2023-02-16 DIAGNOSIS — E1122 Type 2 diabetes mellitus with diabetic chronic kidney disease: Secondary | ICD-10-CM | POA: Diagnosis not present

## 2023-02-16 DIAGNOSIS — I5032 Chronic diastolic (congestive) heart failure: Secondary | ICD-10-CM | POA: Diagnosis not present

## 2023-02-16 DIAGNOSIS — E039 Hypothyroidism, unspecified: Secondary | ICD-10-CM | POA: Diagnosis not present

## 2023-02-16 DIAGNOSIS — I10 Essential (primary) hypertension: Secondary | ICD-10-CM | POA: Diagnosis not present

## 2023-03-08 DIAGNOSIS — E1122 Type 2 diabetes mellitus with diabetic chronic kidney disease: Secondary | ICD-10-CM | POA: Diagnosis not present

## 2023-03-08 DIAGNOSIS — I130 Hypertensive heart and chronic kidney disease with heart failure and stage 1 through stage 4 chronic kidney disease, or unspecified chronic kidney disease: Secondary | ICD-10-CM | POA: Diagnosis not present

## 2023-03-08 DIAGNOSIS — I5032 Chronic diastolic (congestive) heart failure: Secondary | ICD-10-CM | POA: Diagnosis not present

## 2023-03-08 DIAGNOSIS — N183 Chronic kidney disease, stage 3 unspecified: Secondary | ICD-10-CM | POA: Diagnosis not present

## 2023-03-15 DIAGNOSIS — N184 Chronic kidney disease, stage 4 (severe): Secondary | ICD-10-CM | POA: Diagnosis not present

## 2023-03-15 DIAGNOSIS — I5032 Chronic diastolic (congestive) heart failure: Secondary | ICD-10-CM | POA: Diagnosis not present

## 2023-03-15 DIAGNOSIS — E039 Hypothyroidism, unspecified: Secondary | ICD-10-CM | POA: Diagnosis not present

## 2023-03-15 DIAGNOSIS — E785 Hyperlipidemia, unspecified: Secondary | ICD-10-CM | POA: Diagnosis not present

## 2023-03-15 DIAGNOSIS — X100XXA Contact with hot drinks, initial encounter: Secondary | ICD-10-CM | POA: Diagnosis not present

## 2023-03-15 DIAGNOSIS — E1122 Type 2 diabetes mellitus with diabetic chronic kidney disease: Secondary | ICD-10-CM | POA: Diagnosis not present

## 2023-03-15 DIAGNOSIS — E7800 Pure hypercholesterolemia, unspecified: Secondary | ICD-10-CM | POA: Diagnosis not present

## 2023-03-15 DIAGNOSIS — I10 Essential (primary) hypertension: Secondary | ICD-10-CM | POA: Diagnosis not present

## 2023-04-12 DIAGNOSIS — I10 Essential (primary) hypertension: Secondary | ICD-10-CM | POA: Diagnosis not present

## 2023-04-12 DIAGNOSIS — E1169 Type 2 diabetes mellitus with other specified complication: Secondary | ICD-10-CM | POA: Diagnosis not present

## 2023-04-12 DIAGNOSIS — Z6379 Other stressful life events affecting family and household: Secondary | ICD-10-CM | POA: Diagnosis not present

## 2023-04-25 DIAGNOSIS — E039 Hypothyroidism, unspecified: Secondary | ICD-10-CM | POA: Diagnosis not present

## 2023-04-25 DIAGNOSIS — I10 Essential (primary) hypertension: Secondary | ICD-10-CM | POA: Diagnosis not present

## 2023-04-25 DIAGNOSIS — I5032 Chronic diastolic (congestive) heart failure: Secondary | ICD-10-CM | POA: Diagnosis not present

## 2023-04-25 DIAGNOSIS — E1122 Type 2 diabetes mellitus with diabetic chronic kidney disease: Secondary | ICD-10-CM | POA: Diagnosis not present

## 2023-05-02 DIAGNOSIS — I129 Hypertensive chronic kidney disease with stage 1 through stage 4 chronic kidney disease, or unspecified chronic kidney disease: Secondary | ICD-10-CM | POA: Diagnosis not present

## 2023-05-02 DIAGNOSIS — J449 Chronic obstructive pulmonary disease, unspecified: Secondary | ICD-10-CM | POA: Diagnosis not present

## 2023-05-02 DIAGNOSIS — N390 Urinary tract infection, site not specified: Secondary | ICD-10-CM | POA: Diagnosis not present

## 2023-05-02 DIAGNOSIS — Z6841 Body Mass Index (BMI) 40.0 and over, adult: Secondary | ICD-10-CM | POA: Diagnosis not present

## 2023-05-02 DIAGNOSIS — D631 Anemia in chronic kidney disease: Secondary | ICD-10-CM | POA: Diagnosis not present

## 2023-05-02 DIAGNOSIS — E1122 Type 2 diabetes mellitus with diabetic chronic kidney disease: Secondary | ICD-10-CM | POA: Diagnosis not present

## 2023-05-02 DIAGNOSIS — N184 Chronic kidney disease, stage 4 (severe): Secondary | ICD-10-CM | POA: Diagnosis not present

## 2023-06-22 DIAGNOSIS — E1165 Type 2 diabetes mellitus with hyperglycemia: Secondary | ICD-10-CM | POA: Diagnosis not present

## 2023-06-22 DIAGNOSIS — N184 Chronic kidney disease, stage 4 (severe): Secondary | ICD-10-CM | POA: Diagnosis not present

## 2023-06-22 DIAGNOSIS — I5032 Chronic diastolic (congestive) heart failure: Secondary | ICD-10-CM | POA: Diagnosis not present

## 2023-06-22 DIAGNOSIS — E1142 Type 2 diabetes mellitus with diabetic polyneuropathy: Secondary | ICD-10-CM | POA: Diagnosis not present

## 2023-06-22 DIAGNOSIS — Z6835 Body mass index (BMI) 35.0-35.9, adult: Secondary | ICD-10-CM | POA: Diagnosis not present

## 2023-06-22 DIAGNOSIS — E039 Hypothyroidism, unspecified: Secondary | ICD-10-CM | POA: Diagnosis not present

## 2023-06-22 DIAGNOSIS — J449 Chronic obstructive pulmonary disease, unspecified: Secondary | ICD-10-CM | POA: Diagnosis not present

## 2023-06-22 DIAGNOSIS — R269 Unspecified abnormalities of gait and mobility: Secondary | ICD-10-CM | POA: Diagnosis not present

## 2023-06-24 ENCOUNTER — Ambulatory Visit: Payer: Medicare HMO | Admitting: Podiatry

## 2023-06-24 DIAGNOSIS — M722 Plantar fascial fibromatosis: Secondary | ICD-10-CM

## 2023-06-24 NOTE — Patient Instructions (Signed)

## 2023-06-24 NOTE — Progress Notes (Signed)
  Subjective:  Patient ID: Rebekah Ray, female    DOB: [birth year],  MRN: [id number]  Chief Complaint  Patient presents with   Plantar Fasciitis    Room 20  Plantar Fasciitis.  Requests an injection.  Diabetic.    67 y.o. female presents with the above complaint.  Patient states she is having a flareup in her right heel.  Has pain along the bottom of the arch.  Requesting injection.   Review of Systems: Negative except as noted in the HPI. Denies N/V/F/Ch.   Objective:  There were no vitals filed for this visit. There is no height or weight on file to calculate BMI. Constitutional Well developed. Well nourished.  Vascular Dorsalis pedis pulses palpable bilaterally. Posterior tibial pulses palpable bilaterally. Capillary refill normal to all digits.  No cyanosis or clubbing noted. Pedal hair growth normal.  Neurologic Normal speech. Oriented to person, place, and time. Epicritic sensation to light touch grossly present bilaterally.  Dermatologic Nails well groomed and normal in appearance. No open wounds. No skin lesions.  Orthopedic: Normal joint ROM without pain or crepitus bilaterally. No visible deformities. Tender to palpation at the calcaneal tuber right. No pain with calcaneal squeeze right. Ankle ROM diminished range of motion right. Silfverskiold Test: negative right.     Assessment:   1. Plantar fasciitis, right    Plan:  Patient was evaluated and treated and all questions answered.  Plantar Fasciitis, right -Patient is a flareup in pain recommend another steroid injection and she is agreeable - Educated on icing and stretching. Instructions given.  - Injection delivered to the plantar fascia as below. - DME: Continue power step orthotics - Pharmacologic management: Ibuprofen or meloxicam as needed for pain  Procedure: Injection Tendon/Ligament Location: Right plantar fascia at the glabrous junction; medial approach. Skin Prep: alcohol Injectate: 1 cc  0.5% marcaine plain, 1 cc kenalog 10. Disposition: Patient tolerated procedure well. Injection site dressed with a band-aid.  Return if symptoms worsen or fail to improve.

## 2023-07-05 ENCOUNTER — Encounter: Payer: Self-pay | Admitting: Neurology

## 2023-07-05 ENCOUNTER — Ambulatory Visit: Payer: Medicare HMO | Admitting: Neurology

## 2023-07-05 VITALS — BP 152/83 | HR 93 | Ht 59.0 in | Wt 180.5 lb

## 2023-07-05 DIAGNOSIS — R269 Unspecified abnormalities of gait and mobility: Secondary | ICD-10-CM | POA: Insufficient documentation

## 2023-07-05 DIAGNOSIS — R251 Tremor, unspecified: Secondary | ICD-10-CM | POA: Insufficient documentation

## 2023-07-05 DIAGNOSIS — E1142 Type 2 diabetes mellitus with diabetic polyneuropathy: Secondary | ICD-10-CM | POA: Diagnosis not present

## 2023-07-05 MED ORDER — GABAPENTIN 300 MG PO CAPS: 300.0000 mg | ORAL_CAPSULE | Freq: Three times a day (TID) | ORAL | 11 refills | Status: DC

## 2023-07-05 NOTE — Progress Notes (Signed)
Chief Complaint  Patient presents with   Gait Problem    Rm15, alone abnormalities of gait and mobility:some times legs feel heavy, occasional rls won't stop moving      ASSESSMENT AND PLAN  Rebekah Ray is a 67 y.o. female   Uncontrollable bilateral lower extremity shaking when bearing weight,  Brisk reflex on examinations,  MRI of cervical spine to rule out cervical spondylitic myelopathy  Responding to gabapentin, will increase dose to 300 mg 3 times daily, chronic kidney disease, GFR of 29,  Return To Clinic With NP In 6 Months if she remains symptomatic with current dose of gabapentin 300 mg 3 times daily, may consider very low-dose of clonazepam, depending on MRI cervical spine findings  DIAGNOSTIC DATA (LABS, IMAGING, TESTING) - I reviewed patient records, labs, notes, testing and imaging myself where available.   MEDICAL HISTORY:  Rebekah Ray is a 67 year old female, seen in request by [facility] Dr. Mirna Mires, for evaluation of gait abnormality, initial evaluation July 05, 2023  I reviewed and summarized the referring note. PMHX DM since 2000 Hypothyrodism HLD HTN  She reported trouble walking bilateral leg shaking since 2022, initially only intermittent, she contributed to her low back pain, hip pain, also planta fasciitis of bilateral feet, but her symptoms gradually getting worse, she now works at [employer], requires standing walking for extended period of time, after standing for a while, she began to have uncontrollable large amplitude leg shaking, I was able to weakness her leg shaking when standing and while walking, large amplitude, involving both leg, but there was no interruption of her balance  Few months ago she was put on gabapentin now taking 100 mg 2 tablets twice a day, she tolerated well, no excessive drowsiness fatigue, helped her symptoms 50%,  She denies symptoms while sitting or trying to sleep,   Laboratory values from August 2024,  normal thyroid functional test, glucose 173, creatinine 1.86, GFR of 29,   PHYSICAL EXAM:   Vitals:   07/05/23 1041 07/05/23 1045  BP: (!) 163/77 (!) 152/83  Pulse: 93   Weight: 180 lb 8 oz (81.9 kg)   Height: 4\' 11"  (1.499 m)       Body mass index is 36.46 kg/m.  PHYSICAL EXAMNIATION:  Gen: NAD, conversant, well nourised, well groomed                     Cardiovascular: Regular rate rhythm, no peripheral edema, warm, nontender. Eyes: Conjunctivae clear without exudates or hemorrhage Neck: Supple, no carotid bruits. Pulmonary: Clear to auscultation bilaterally   NEUROLOGICAL EXAM:  MENTAL STATUS: Speech/cognition: Awake, alert, oriented to history taking and casual conversation CRANIAL NERVES: CN II: Visual fields are full to confrontation. Pupils are round equal and briskly reactive to light. CN III, IV, VI: extraocular movement are normal. No ptosis. CN V: Facial sensation is intact to light touch CN VII: Face is symmetric with normal eye closure  CN VIII: Hearing is normal to causal conversation. CN IX, X: Phonation is normal. CN XI: Head turning and shoulder shrug are intact  MOTOR: There is no pronator drift of out-stretched arms. Muscle bulk and tone are normal. Muscle strength is normal.  REFLEXES: Reflexes are 2+ and symmetric at the biceps, triceps, knees, and ankles. Plantar responses are flexor.  SENSORY: Intact to light touch, pinprick and vibratory sensation are intact in fingers and toes.  COORDINATION: There is no trunk or limb dysmetria noted.  GAIT/STANCE: Push-up to  get up from seated position, after standing for a while, she began to have bilateral lower extremity shaking, continued while she walking, but able to keep balance.  REVIEW OF SYSTEMS:  Full 14 system review of systems performed and notable only for as above All other review of systems were negative.   ALLERGIES: Allergies  Allergen Reactions   Benadryl [Diphenhydramine]      Makes heart race   Norvasc [Amlodipine Besylate]     edema    HOME MEDICATIONS: Current Outpatient Medications  Medication Sig Dispense Refill   acetaminophen-codeine (TYLENOL #3) 300-30 MG tablet Take 1 tablet by mouth every 6 (six) hours as needed.     aspirin EC 81 MG tablet Take 81 mg by mouth daily.     atorvastatin (LIPITOR) 40 MG tablet Take 1 tablet (40 mg total) by mouth daily. Keep upcoming appointment for future refills. 90 tablet 1   carvedilol (COREG) 25 MG tablet Take 1 tablet (25 mg total) by mouth 2 (two) times daily with a meal. 180 tablet 1   fenofibrate (TRICOR) 48 MG tablet Take 48 mg by mouth daily.     ferrous sulfate 325 (65 FE) MG tablet Take 162.5 mg by mouth 2 (two) times a week.     furosemide (LASIX) 40 MG tablet Take 40 mg by mouth 2 (two) times daily.     gabapentin (NEURONTIN) 100 MG capsule Take 200 mg by mouth 2 (two) times daily.     hydrALAZINE (APRESOLINE) 50 MG tablet Take 1 tablet (50 mg total) by mouth every 8 (eight) hours. (Patient taking differently: Take 100 mg by mouth in the morning and at bedtime.) 90 tablet 0   JARDIANCE 10 MG TABS tablet Take 10 mg by mouth daily.     levothyroxine (SYNTHROID, LEVOTHROID) 50 MCG tablet Take 50 mcg by mouth daily before breakfast.     Multiple Vitamin (MULTIVITAMIN) tablet Take 1 tablet by mouth daily.     Multiple Vitamins-Minerals (MULTIVITAMIN WITH MINERALS) tablet Take 1 tablet by mouth daily.     Semaglutide,0.25 or 0.5MG /DOS, (OZEMPIC, 0.25 OR 0.5 MG/DOSE,) 2 MG/1.5ML SOPN Inject 0.5 mg into the skin every 7 (seven) days.     sitaGLIPtin (JANUVIA) 25 MG tablet Take 25 mg by mouth daily.     spironolactone (ALDACTONE) 25 MG tablet Take 25 mg by mouth daily.     TOUJEO MAX SOLOSTAR 300 UNIT/ML Solostar Pen Inject 35 Units into the skin daily.     No current facility-administered medications for this visit.    PAST MEDICAL HISTORY: Past Medical History:  Diagnosis Date   COPD (chronic obstructive  pulmonary disease) (HCC)    Diabetes mellitus without complication (HCC)    Hyperlipidemia    Hypertension    Renal disorder    Thyroid disease     PAST SURGICAL HISTORY: Past Surgical History:  Procedure Laterality Date   BREAST BIOPSY Right    CESAREAN SECTION     COLONOSCOPY WITH PROPOFOL N/A 11/11/2020   non-bleeding internal hemorrhoids, few small-mouthed diverticula, 2 mm polyp in ascending, 4 mm polyp in transverse. Tubular adenoma. Surveillance 5 years.    FOOT SURGERY     POLYPECTOMY  11/11/2020   Procedure: POLYPECTOMY;  Surgeon: Lanelle Bal, DO;  [facility]: [facility];  Service: Endoscopy;;   TUBAL LIGATION      FAMILY HISTORY: Family History  Problem Relation Age of Onset   Ovarian cancer Mother    Kidney disease Mother    Heart  Problems Mother    Heart attack Mother    Kidney disease Father    Diabetes Father    Heart attack Father    Stroke Brother     SOCIAL HISTORY: Social History   Socioeconomic History   Marital status: Divorced    Spouse name: Not on file   Number of children: 2   Years of education: Not on file   Highest education level: Not on file  Occupational History   Occupation: Front Counter  Tobacco Use   Smoking status: Former    Current packs/day: 0.00    Average packs/day: 2.0 packs/day for 42.0 years (84.0 ttl pk-yrs)    Types: Cigarettes    Start date: 01/23/1972    Quit date: 01/22/2014    Years since quitting: 9.4   Smokeless tobacco: Former  Substance and Sexual Activity   Alcohol use: No   Drug use: No   Sexual activity: Never  Other Topics Concern   Not on file  Social History Narrative   Not on file   Social Determinants of Health   Financial Resource Strain: Not on file  Food Insecurity: Not on file  Transportation Needs: Not on file  Physical Activity: Not on file  Stress: Not on file  Social Connections: Not on file  Intimate Partner Violence: Not on file      Levert Feinstein, M.D. Ph.D.  [facility] [address] [place], [place] [address] Ph: [phone] Fax: [phone]  CC:  Mirna Mires, MD [address] [place],  [place] [address]  Mirna Mires, MD

## 2023-07-12 ENCOUNTER — Telehealth: Payer: Self-pay | Admitting: Neurology

## 2023-07-12 NOTE — Telephone Encounter (Signed)
The correct tracking #[id number]

## 2023-07-12 NOTE — Telephone Encounter (Signed)
Cohere Berkley Harvey: [id number] exp. 07/12/23-09/10/23 sent to GI [phone]

## 2023-08-06 ENCOUNTER — Ambulatory Visit
Admission: RE | Admit: 2023-08-06 | Discharge: 2023-08-06 | Disposition: A | Discharge status: Home / Self Care | Payer: Medicare HMO | Source: Ambulatory Visit | Attending: Neurology | Admitting: Neurology

## 2023-08-06 DIAGNOSIS — R251 Tremor, unspecified: Secondary | ICD-10-CM

## 2023-08-06 DIAGNOSIS — R269 Unspecified abnormalities of gait and mobility: Secondary | ICD-10-CM

## 2023-09-05 DIAGNOSIS — E1142 Type 2 diabetes mellitus with diabetic polyneuropathy: Secondary | ICD-10-CM | POA: Diagnosis not present

## 2023-09-05 DIAGNOSIS — R251 Tremor, unspecified: Secondary | ICD-10-CM | POA: Diagnosis not present

## 2023-09-05 DIAGNOSIS — E039 Hypothyroidism, unspecified: Secondary | ICD-10-CM | POA: Diagnosis not present

## 2023-09-05 DIAGNOSIS — E1122 Type 2 diabetes mellitus with diabetic chronic kidney disease: Secondary | ICD-10-CM | POA: Diagnosis not present

## 2023-09-05 DIAGNOSIS — N184 Chronic kidney disease, stage 4 (severe): Secondary | ICD-10-CM | POA: Diagnosis not present

## 2023-09-05 DIAGNOSIS — Z794 Long term (current) use of insulin: Secondary | ICD-10-CM | POA: Diagnosis not present

## 2023-09-05 DIAGNOSIS — Z6835 Body mass index (BMI) 35.0-35.9, adult: Secondary | ICD-10-CM | POA: Diagnosis not present

## 2023-09-05 DIAGNOSIS — R269 Unspecified abnormalities of gait and mobility: Secondary | ICD-10-CM | POA: Diagnosis not present

## 2023-10-13 DIAGNOSIS — D631 Anemia in chronic kidney disease: Secondary | ICD-10-CM | POA: Diagnosis not present

## 2023-10-13 DIAGNOSIS — E1122 Type 2 diabetes mellitus with diabetic chronic kidney disease: Secondary | ICD-10-CM | POA: Diagnosis not present

## 2023-10-13 DIAGNOSIS — N184 Chronic kidney disease, stage 4 (severe): Secondary | ICD-10-CM | POA: Diagnosis not present

## 2023-10-13 DIAGNOSIS — J449 Chronic obstructive pulmonary disease, unspecified: Secondary | ICD-10-CM | POA: Diagnosis not present

## 2023-10-13 DIAGNOSIS — Z6841 Body Mass Index (BMI) 40.0 and over, adult: Secondary | ICD-10-CM | POA: Diagnosis not present

## 2023-10-13 DIAGNOSIS — I129 Hypertensive chronic kidney disease with stage 1 through stage 4 chronic kidney disease, or unspecified chronic kidney disease: Secondary | ICD-10-CM | POA: Diagnosis not present

## 2023-10-31 DIAGNOSIS — J219 Acute bronchiolitis, unspecified: Secondary | ICD-10-CM | POA: Diagnosis not present

## 2023-10-31 DIAGNOSIS — R490 Dysphonia: Secondary | ICD-10-CM | POA: Diagnosis not present

## 2023-10-31 DIAGNOSIS — N393 Stress incontinence (female) (male): Secondary | ICD-10-CM | POA: Diagnosis not present

## 2023-10-31 DIAGNOSIS — N184 Chronic kidney disease, stage 4 (severe): Secondary | ICD-10-CM | POA: Diagnosis not present

## 2023-10-31 DIAGNOSIS — E1122 Type 2 diabetes mellitus with diabetic chronic kidney disease: Secondary | ICD-10-CM | POA: Diagnosis not present

## 2023-11-14 DIAGNOSIS — E1169 Type 2 diabetes mellitus with other specified complication: Secondary | ICD-10-CM | POA: Diagnosis not present

## 2023-11-14 DIAGNOSIS — Z6836 Body mass index (BMI) 36.0-36.9, adult: Secondary | ICD-10-CM | POA: Diagnosis not present

## 2023-11-14 DIAGNOSIS — R490 Dysphonia: Secondary | ICD-10-CM | POA: Diagnosis not present

## 2023-11-14 DIAGNOSIS — N393 Stress incontinence (female) (male): Secondary | ICD-10-CM | POA: Diagnosis not present

## 2023-11-14 DIAGNOSIS — E1165 Type 2 diabetes mellitus with hyperglycemia: Secondary | ICD-10-CM | POA: Diagnosis not present

## 2023-11-14 DIAGNOSIS — J219 Acute bronchiolitis, unspecified: Secondary | ICD-10-CM | POA: Diagnosis not present

## 2023-11-15 LAB — LAB REPORT - SCANNED: EGFR: 25

## 2023-12-20 DIAGNOSIS — Z6836 Body mass index (BMI) 36.0-36.9, adult: Secondary | ICD-10-CM | POA: Diagnosis not present

## 2023-12-20 DIAGNOSIS — E782 Mixed hyperlipidemia: Secondary | ICD-10-CM | POA: Diagnosis not present

## 2023-12-20 DIAGNOSIS — I129 Hypertensive chronic kidney disease with stage 1 through stage 4 chronic kidney disease, or unspecified chronic kidney disease: Secondary | ICD-10-CM | POA: Diagnosis not present

## 2023-12-20 DIAGNOSIS — E1165 Type 2 diabetes mellitus with hyperglycemia: Secondary | ICD-10-CM | POA: Diagnosis not present

## 2023-12-20 DIAGNOSIS — N184 Chronic kidney disease, stage 4 (severe): Secondary | ICD-10-CM | POA: Diagnosis not present

## 2023-12-20 DIAGNOSIS — I10 Essential (primary) hypertension: Secondary | ICD-10-CM | POA: Diagnosis not present

## 2023-12-20 DIAGNOSIS — E038 Other specified hypothyroidism: Secondary | ICD-10-CM | POA: Diagnosis not present

## 2023-12-21 LAB — LAB REPORT - SCANNED
A1c: 11.6
EGFR: 21

## 2024-01-02 ENCOUNTER — Other Ambulatory Visit (HOSPITAL_COMMUNITY): Payer: Self-pay | Admitting: Family Medicine

## 2024-01-02 DIAGNOSIS — Z1231 Encounter for screening mammogram for malignant neoplasm of breast: Secondary | ICD-10-CM

## 2024-01-06 ENCOUNTER — Encounter (HOSPITAL_COMMUNITY): Payer: Self-pay

## 2024-01-06 ENCOUNTER — Ambulatory Visit (HOSPITAL_COMMUNITY)
Admission: RE | Admit: 2024-01-06 | Discharge: 2024-01-06 | Disposition: A | Discharge status: Home / Self Care | Payer: Medicare HMO | Source: Ambulatory Visit | Attending: Family Medicine | Admitting: Family Medicine

## 2024-01-06 DIAGNOSIS — Z1231 Encounter for screening mammogram for malignant neoplasm of breast: Secondary | ICD-10-CM | POA: Diagnosis not present

## 2024-01-17 DIAGNOSIS — E782 Mixed hyperlipidemia: Secondary | ICD-10-CM | POA: Diagnosis not present

## 2024-01-17 DIAGNOSIS — E1165 Type 2 diabetes mellitus with hyperglycemia: Secondary | ICD-10-CM | POA: Diagnosis not present

## 2024-01-17 DIAGNOSIS — N185 Chronic kidney disease, stage 5: Secondary | ICD-10-CM | POA: Diagnosis not present

## 2024-01-17 DIAGNOSIS — I120 Hypertensive chronic kidney disease with stage 5 chronic kidney disease or end stage renal disease: Secondary | ICD-10-CM | POA: Diagnosis not present

## 2024-01-31 NOTE — Progress Notes (Addendum)
 Chief Complaint  Patient presents with   Tremors    Rm 3 alone Pt is well, reports she has retired. If she is standing for a long period of time, her L side will tingling and become weak.  Still has impaired gait       ASSESSMENT AND PLAN  Rebekah Ray is a 68 y.o. female   Uncontrollable bilateral lower extremity shaking when bearing weight,  Start clonazepam 0.25 mg twice daily as needed, discussed potential side effects and additional information provided in AVS  Continue gabapentin 300 mg 3 times daily, will request recent lab work by PCP to further evaluate current kidney function to see if dosage is able to be increased.  ADDENDUM: Received recent lab work from PCP office collected 12/20/2023 with calculated creatinine clearance at 30 (creatinine 2.49) making current dose the max amt of gabapentin of 900 mg/day.  If repeat lab work shows decline of kidney function, gabapentin dosage should be lowered to a max of 600 mg/day.   Declines interest in PT at this time due to financial reasons, she was advised to call if interested in pursuing in the future  MRI of cervical spine multilevel degenerative changes, no spinal cord or nerve root compression     Follow-up with Dr. Terrace Ray in 3 to 4 months for further recommendations or call earlier if needed      DIAGNOSTIC DATA (LABS, IMAGING, TESTING) - I reviewed patient records, labs, notes, testing and imaging myself where available.    08/06/2023 MRI C-spine IMPRESSION: This MRI of the cervical spine without contrast shows the following: The spinal cord appears normal. At C5-C6, there is borderline spinal stenosis and mild to moderate foraminal narrowing but no nerve root compression. At C6-C7, there is mild spinal stenosis and moderate bilateral foraminal narrowing.  There does not appear to be nerve root compression. Milder degenerative changes at C4-C5 and C7-T1 do not lead to spinal stenosis or nerve root  compression.     MEDICAL HISTORY:  Update 02/01/2024 JM: Patient returns for follow-up visit after prior visit with Dr. Terrace Ray 7 months ago.  Reports continued bilateral leg shaking with ambulation.  Did note some improvement on increase gabapentin dosage currently on 300 mg 3 times daily, denies side effects.  She is still greatly limited in regards to daytime activity.  She has since retired which she feels has helped her symptoms some.  Reports continued left arm and leg intermittent numbness after prolonged use/activity, she feels this has progressed some. MRI C-spine showed multilevel degenerative changes but no evidence of spinal cord or nerve root compression.      Consult visit 07/05/2023 Dr. Terrace Ray: Rebekah Ray is a 68 year old female, seen in request by [facility] Dr. Mirna Ray, for evaluation of gait abnormality, initial evaluation July 05, 2023  I reviewed and summarized the referring note. PMHX DM since 2000 Hypothyrodism HLD HTN  She reported trouble walking bilateral leg shaking since 2022, initially only intermittent, she contributed to her low back pain, hip pain, also planta fasciitis of bilateral feet, but her symptoms gradually getting worse, she now works at [employer], requires standing walking for extended period of time, after standing for a while, she began to have uncontrollable large amplitude leg shaking, I was able to weakness her leg shaking when standing and while walking, large amplitude, involving both leg, but there was no interruption of her balance  Few months ago she was put on gabapentin now taking 100 mg 2  tablets twice a day, she tolerated well, no excessive drowsiness fatigue, helped her symptoms 50%,  She denies symptoms while sitting or trying to sleep,   Laboratory values from August 2024, normal thyroid functional test, glucose 173, creatinine 1.86, GFR of 29,      PHYSICAL EXAM:   Vitals:   02/01/24 1030  BP: (!) 141/91  Pulse: 72   Weight: 192 lb (87.1 kg)  Height: 4\' 11"  (1.499 m)   Body mass index is 38.78 kg/m.  PHYSICAL EXAMNIATION:  Gen: NAD, very pleasant middle-age Caucasian female, conversant, well nourised, well groomed                     Cardiovascular: Regular rate rhythm, no peripheral edema, warm, nontender. Eyes: Conjunctivae clear without exudates or hemorrhage Neck: Supple, no carotid bruits. Pulmonary: Clear to auscultation bilaterally   NEUROLOGICAL EXAM:  MENTAL STATUS: Speech/cognition: Awake, alert, oriented to history taking and casual conversation CRANIAL NERVES: CN II: Visual fields are full to confrontation. Pupils are round equal and briskly reactive to light. CN III, IV, VI: extraocular movement are normal. No ptosis. CN V: Facial sensation is intact to light touch CN VII: Face is symmetric with normal eye closure  CN VIII: Hearing is normal to causal conversation. CN IX, X: Phonation is normal. CN XI: Head turning and shoulder shrug are intact  MOTOR: There is no pronator drift of out-stretched arms. Muscle bulk and tone are normal. Muscle strength is normal.  REFLEXES: Reflexes are 2+ and symmetric at the biceps, triceps, knees, and ankles. Plantar responses are flexor.  SENSORY: Intact to light touch, pinprick and vibratory sensation are intact in fingers and toes.  COORDINATION: There is no trunk or limb dysmetria noted.  GAIT/STANCE: Push-up to get up from seated position, after walking short distance, began to have bilateral lower extremity shaking, large amplitude, continued while walking, balance remained intact     REVIEW OF SYSTEMS:  Full 14 system review of systems performed and notable only for as above All other review of systems were negative.   ALLERGIES: Allergies  Allergen Reactions   Benadryl [Diphenhydramine]     Makes heart race   Norvasc [Amlodipine Besylate]     edema    HOME MEDICATIONS: Current Outpatient Medications  Medication  Sig Dispense Refill   acetaminophen-codeine (TYLENOL #3) 300-30 MG tablet Take 1 tablet by mouth every 6 (six) hours as needed.     aspirin EC 81 MG tablet Take 81 mg by mouth daily.     atorvastatin (LIPITOR) 40 MG tablet Take 1 tablet (40 mg total) by mouth daily. Keep upcoming appointment for future refills. 90 tablet 1   carvedilol (COREG) 25 MG tablet Take 1 tablet (25 mg total) by mouth 2 (two) times daily with a meal. 180 tablet 1   Continuous Glucose Sensor (FREESTYLE LIBRE 3 PLUS SENSOR) MISC SMARTSIG:1 Topical     ferrous sulfate 325 (65 FE) MG tablet Take 162.5 mg by mouth 2 (two) times a week.     furosemide (LASIX) 40 MG tablet Take 40 mg by mouth 2 (two) times daily.     gabapentin (NEURONTIN) 300 MG capsule Take 1 capsule (300 mg total) by mouth 3 (three) times daily. 90 capsule 11   HUMALOG KWIKPEN 100 UNIT/ML KwikPen USE SLIDING SCALE AS GIVEN THREE TIMES DAILY WITH EACH MEAL, NOT TO EXCEED 100 UNITS PER DAY     hydrALAZINE (APRESOLINE) 50 MG tablet Take 1 tablet (50 mg total) by  mouth every 8 (eight) hours. (Patient taking differently: Take 100 mg by mouth in the morning and at bedtime.) 90 tablet 0   JARDIANCE 10 MG TABS tablet Take 10 mg by mouth daily.     levothyroxine (SYNTHROID, LEVOTHROID) 50 MCG tablet Take 50 mcg by mouth daily before breakfast.     Multiple Vitamins-Minerals (MULTIVITAMIN WITH MINERALS) tablet Take 1 tablet by mouth daily.     Semaglutide,0.25 or 0.5MG /DOS, (OZEMPIC, 0.25 OR 0.5 MG/DOSE,) 2 MG/1.5ML SOPN Inject 0.5 mg into the skin every 7 (seven) days.     sitaGLIPtin (JANUVIA) 25 MG tablet Take 25 mg by mouth daily.     spironolactone (ALDACTONE) 25 MG tablet Take 25 mg by mouth daily.     TOUJEO MAX SOLOSTAR 300 UNIT/ML Solostar Pen Inject 35 Units into the skin daily.     fenofibrate (TRICOR) 48 MG tablet Take 48 mg by mouth daily. (Patient not taking: Reported on 02/01/2024)     Multiple Vitamin (MULTIVITAMIN) tablet Take 1 tablet by mouth daily.  (Patient not taking: Reported on 02/01/2024)     No current facility-administered medications for this visit.    PAST MEDICAL HISTORY: Past Medical History:  Diagnosis Date   COPD (chronic obstructive pulmonary disease) (HCC)    Diabetes mellitus without complication (HCC)    Hyperlipidemia    Hypertension    Renal disorder    Thyroid disease     PAST SURGICAL HISTORY: Past Surgical History:  Procedure Laterality Date   BREAST BIOPSY Right 1985   benign   CESAREAN SECTION     COLONOSCOPY WITH PROPOFOL N/A 11/11/2020   non-bleeding internal hemorrhoids, few small-mouthed diverticula, 2 mm polyp in ascending, 4 mm polyp in transverse. Tubular adenoma. Surveillance 5 years.    FOOT SURGERY     POLYPECTOMY  11/11/2020   Procedure: POLYPECTOMY;  Surgeon: Lanelle Bal, DO;  Location: [facility];  Service: Endoscopy;;   TUBAL LIGATION      FAMILY HISTORY: Family History  Problem Relation Age of Onset   Ovarian cancer Mother    Kidney disease Mother    Heart Problems Mother    Heart attack Mother    Kidney disease Father    Diabetes Father    Heart attack Father    Stroke Brother     SOCIAL HISTORY: Social History   Socioeconomic History   Marital status: Divorced    Spouse name: Not on file   Number of children: 2   Years of education: Not on file   Highest education level: Not on file  Occupational History   Occupation: Front Counter  Tobacco Use   Smoking status: Former    Current packs/day: 0.00    Average packs/day: 2.0 packs/day for 42.0 years (84.0 ttl pk-yrs)    Types: Cigarettes    Start date: 01/23/1972    Quit date: 01/22/2014    Years since quitting: 10.0   Smokeless tobacco: Former  Substance and Sexual Activity   Alcohol use: No   Drug use: No   Sexual activity: Never  Other Topics Concern   Not on file  Social History Narrative   Not on file   Social Drivers of Health   Financial Resource Strain: Not on file  Food Insecurity: Not  on file  Transportation Needs: Not on file  Physical Activity: Not on file  Stress: Not on file  Social Connections: Not on file  Intimate Partner Violence: Not on file      I  spent 25 minutes of face-to-face and non-face-to-face time with patient.  This included previsit chart review, lab review, study review, order entry, electronic health record documentation, patient education and discussion regarding above diagnoses and treatment plan and answered all other questions to patient's satisfaction  Ihor Austin, [facility]  [facility] [address] [place], [place] [address]  Phone [phone] Fax [phone] Note: This document was prepared with digital dictation and possible smart phrase technology. Any transcriptional errors that result from this process are unintentional.

## 2024-02-01 ENCOUNTER — Encounter: Payer: Self-pay | Admitting: Adult Health

## 2024-02-01 ENCOUNTER — Ambulatory Visit (INDEPENDENT_AMBULATORY_CARE_PROVIDER_SITE_OTHER): Payer: Medicare HMO | Admitting: Adult Health

## 2024-02-01 VITALS — BP 141/91 | HR 72 | Ht 59.0 in | Wt 192.0 lb

## 2024-02-01 DIAGNOSIS — R251 Tremor, unspecified: Secondary | ICD-10-CM

## 2024-02-01 DIAGNOSIS — R269 Unspecified abnormalities of gait and mobility: Secondary | ICD-10-CM

## 2024-02-01 MED ORDER — CLONAZEPAM 0.5 MG PO TABS: 0.2500 mg | ORAL_TABLET | Freq: Two times a day (BID) | ORAL | 0 refills | Status: DC | PRN

## 2024-02-01 NOTE — Patient Instructions (Addendum)
 Start Klonopin 0.25mg  twice daily as needed for muscle jerks   Continue gabapentin 300mg  three times daily   If you are interested working with therapy, please let me know      Follow up with Dr. Terrace Arabia in 3-4 months or call earlier if needed     Clonazepam Tablets What is this medication? CLONAZEPAM (kloe NA ze pam) treats seizures. It may also be used to treat panic disorder. It works by Education administrator system calm down. It belongs to a group of medications called benzodiazepines. This medicine may be used for other purposes; ask your health care provider or pharmacist if you have questions. COMMON BRAND NAME(S): Ceberclon, Klonopin What should I tell my care team before I take this medication? They need to know if you have any of these conditions: Bipolar disorder, depression, psychosis, or other mental health conditions Glaucoma Kidney disease Liver disease Lung or breathing disease Myasthenia gravis Parkinson disease Porphyria Seizures or a history of seizures Substance use disorder Suicidal thoughts, plans, or attempt by you or a family member An unusual or allergic reaction to clonazepam, other medications, foods, dyes, or preservatives Pregnant or trying to get pregnant Breastfeeding How should I use this medication? Take this medication by mouth with water. Take it as directed on the prescription label at the same time every day. You can take it with or without food. If it upsets your stomach, take it with food. Do not take it more often than directed. Do not stop taking or change the dose except on the advice of your care team. A special MedGuide will be given to you by the pharmacist with each prescription and refill. Be sure to read this information carefully each time. Talk to your care team about the use of this medication in children. Special care may be needed. Overdosage: If you think you have taken too much of this medicine contact a poison control center or  emergency room at once. NOTE: This medicine is only for you. Do not share this medicine with others. What if I miss a dose? If you miss a dose, take it as soon as you can. If it is almost time for your next dose, take only that dose. Do not take double or extra doses. What may interact with this medication? Do not take this medication with any of the following: Opioid medications for cough Sodium oxybate This medication may also interact with the following: Alcohol Antihistamines for allergy, cough, and cold Antiviral medications for HIV or AIDS Certain medications for anxiety or sleep Certain medications for depression, such as amitriptyline, fluoxetine, sertraline Certain medications for fungal infections, such as ketoconazole and itraconazole Certain medications for seizures, such as carbamazepine, phenobarbital, phenytoin, primidone General anesthetics, such as halothane, isoflurane, methoxyflurane, propofol Local anesthetics, such as lidocaine, pramoxine, tetracaine Medications that relax muscles for surgery Opioid medications for pain Phenothiazines, such as chlorpromazine, mesoridazine, prochlorperazine, thioridazine This list may not describe all possible interactions. Give your health care provider a list of all the medicines, herbs, non-prescription drugs, or dietary supplements you use. Also tell them if you smoke, drink alcohol, or use illegal drugs. Some items may interact with your medicine. What should I watch for while using this medication? Visit your care team for regular checks on your progress. Tell your care team if your symptoms do not start to get better or if they get worse. Do not suddenly stop taking this medication. You may develop a severe reaction. Your care team will tell  you how much medication to take. If your care team wants you to stop the medication, the dose may be slowly lowered over time to avoid any side effects. This medication may affect your  coordination, reaction time, or judgment. Do not drive or operate machinery until you know how this medication affects you. Sit up or stand slowly to reduce the risk of dizzy or fainting spells. Drinking alcohol with this medication can increase the risk of these side effects. If you are taking another medication that also causes drowsiness, you may have more side effects. Give your care team a list of all medications you use. Your care team will tell you how much medication to take. Do not take more medication than directed. Get emergency help right away if you have problems breathing or unusual sleepiness. If you or your family notice any changes in your behavior, such as new or worsening depression, thoughts of harming yourself, anxiety, other unusual or disturbing thoughts, or memory loss, call your care team right away. Talk to your care team if you wish to become pregnant or think you might be pregnant. This medication can cause serious birth defects if taken during pregnancy. Talk to your care team before breastfeeding. Changes to your treatment plan may be needed. What side effects may I notice from receiving this medication? Side effects that you should report to your care team as soon as possible: Allergic reactions--skin rash, itching, hives, swelling of the face, lips, tongue, or throat CNS depression--slow or shallow breathing, shortness of breath, feeling faint, dizziness, confusion, trouble staying awake Thoughts of suicide or self-harm, worsening mood, feelings of depression Side effects that usually do not require medical attention (report to your care team if they continue or are bothersome): Dizziness Drowsiness Headache This list may not describe all possible side effects. Call your doctor for medical advice about side effects. You may report side effects to FDA at 1-800-FDA-1088. Where should I keep my medication? Keep out of the reach of children and pets. This medication can be  abused. Keep your medication in a safe place to protect it from theft. Do not share this medication with anyone. Selling or giving away this medication is dangerous and against the law. Store at room temperature between 15 and 30 degrees C (59 and 86 degrees F). Protect from light. Keep container tightly closed. This medication may cause accidental overdose and death if taken by other adults, children, or pets. Mix any unused medication with a substance like cat litter or coffee grounds. Then throw the medication away in a sealed container like a sealed bag or a coffee can with a lid. Do not use the medication after the expiration date. NOTE: This sheet is a summary. It may not cover all possible information. If you have questions about this medicine, talk to your doctor, pharmacist, or health care provider.  2024 Elsevier/Gold Standard (2022-07-21 00:00:00)

## 2024-02-11 DIAGNOSIS — E119 Type 2 diabetes mellitus without complications: Secondary | ICD-10-CM | POA: Diagnosis not present

## 2024-02-13 DIAGNOSIS — R269 Unspecified abnormalities of gait and mobility: Secondary | ICD-10-CM | POA: Diagnosis not present

## 2024-02-13 DIAGNOSIS — R251 Tremor, unspecified: Secondary | ICD-10-CM | POA: Diagnosis not present

## 2024-02-13 DIAGNOSIS — N184 Chronic kidney disease, stage 4 (severe): Secondary | ICD-10-CM | POA: Diagnosis not present

## 2024-02-13 DIAGNOSIS — E1152 Type 2 diabetes mellitus with diabetic peripheral angiopathy with gangrene: Secondary | ICD-10-CM | POA: Diagnosis not present

## 2024-02-13 DIAGNOSIS — J449 Chronic obstructive pulmonary disease, unspecified: Secondary | ICD-10-CM | POA: Diagnosis not present

## 2024-02-13 DIAGNOSIS — Z794 Long term (current) use of insulin: Secondary | ICD-10-CM | POA: Diagnosis not present

## 2024-02-13 DIAGNOSIS — I10 Essential (primary) hypertension: Secondary | ICD-10-CM | POA: Diagnosis not present

## 2024-02-13 DIAGNOSIS — I130 Hypertensive heart and chronic kidney disease with heart failure and stage 1 through stage 4 chronic kidney disease, or unspecified chronic kidney disease: Secondary | ICD-10-CM | POA: Diagnosis not present

## 2024-02-13 DIAGNOSIS — I5032 Chronic diastolic (congestive) heart failure: Secondary | ICD-10-CM | POA: Diagnosis not present

## 2024-02-13 DIAGNOSIS — E1122 Type 2 diabetes mellitus with diabetic chronic kidney disease: Secondary | ICD-10-CM | POA: Diagnosis not present

## 2024-02-15 DIAGNOSIS — J449 Chronic obstructive pulmonary disease, unspecified: Secondary | ICD-10-CM | POA: Diagnosis not present

## 2024-02-15 DIAGNOSIS — D631 Anemia in chronic kidney disease: Secondary | ICD-10-CM | POA: Diagnosis not present

## 2024-02-15 DIAGNOSIS — I129 Hypertensive chronic kidney disease with stage 1 through stage 4 chronic kidney disease, or unspecified chronic kidney disease: Secondary | ICD-10-CM | POA: Diagnosis not present

## 2024-02-15 DIAGNOSIS — Z6841 Body Mass Index (BMI) 40.0 and over, adult: Secondary | ICD-10-CM | POA: Diagnosis not present

## 2024-02-15 DIAGNOSIS — N390 Urinary tract infection, site not specified: Secondary | ICD-10-CM | POA: Diagnosis not present

## 2024-02-15 DIAGNOSIS — E1122 Type 2 diabetes mellitus with diabetic chronic kidney disease: Secondary | ICD-10-CM | POA: Diagnosis not present

## 2024-02-15 DIAGNOSIS — N184 Chronic kidney disease, stage 4 (severe): Secondary | ICD-10-CM | POA: Diagnosis not present

## 2024-02-17 ENCOUNTER — Other Ambulatory Visit: Payer: Self-pay | Admitting: Family Medicine

## 2024-02-17 DIAGNOSIS — M5416 Radiculopathy, lumbar region: Secondary | ICD-10-CM | POA: Diagnosis not present

## 2024-02-17 DIAGNOSIS — M5412 Radiculopathy, cervical region: Secondary | ICD-10-CM | POA: Diagnosis not present

## 2024-02-27 ENCOUNTER — Ambulatory Visit
Admission: RE | Admit: 2024-02-27 | Discharge: 2024-02-27 | Disposition: A | Discharge status: Home / Self Care | Source: Ambulatory Visit | Attending: Family Medicine | Admitting: Family Medicine

## 2024-02-27 DIAGNOSIS — M47816 Spondylosis without myelopathy or radiculopathy, lumbar region: Secondary | ICD-10-CM | POA: Diagnosis not present

## 2024-02-27 DIAGNOSIS — M48061 Spinal stenosis, lumbar region without neurogenic claudication: Secondary | ICD-10-CM | POA: Diagnosis not present

## 2024-02-27 DIAGNOSIS — M5416 Radiculopathy, lumbar region: Secondary | ICD-10-CM

## 2024-03-05 ENCOUNTER — Other Ambulatory Visit (HOSPITAL_COMMUNITY): Admit: 2024-03-05

## 2024-03-05 ENCOUNTER — Other Ambulatory Visit: Payer: Self-pay | Admitting: Family Medicine

## 2024-03-05 ENCOUNTER — Other Ambulatory Visit (HOSPITAL_COMMUNITY)
Admission: RE | Admit: 2024-03-05 | Discharge: 2024-03-05 | Disposition: A | Discharge status: Home / Self Care | Source: Ambulatory Visit | Attending: Family Medicine | Admitting: Family Medicine

## 2024-03-05 DIAGNOSIS — J449 Chronic obstructive pulmonary disease, unspecified: Secondary | ICD-10-CM | POA: Diagnosis not present

## 2024-03-05 DIAGNOSIS — E1122 Type 2 diabetes mellitus with diabetic chronic kidney disease: Secondary | ICD-10-CM | POA: Diagnosis not present

## 2024-03-05 DIAGNOSIS — Z124 Encounter for screening for malignant neoplasm of cervix: Secondary | ICD-10-CM | POA: Diagnosis not present

## 2024-03-05 DIAGNOSIS — L304 Erythema intertrigo: Secondary | ICD-10-CM | POA: Diagnosis not present

## 2024-03-05 DIAGNOSIS — Z0001 Encounter for general adult medical examination with abnormal findings: Secondary | ICD-10-CM | POA: Diagnosis not present

## 2024-03-05 DIAGNOSIS — N184 Chronic kidney disease, stage 4 (severe): Secondary | ICD-10-CM | POA: Diagnosis not present

## 2024-03-05 DIAGNOSIS — I130 Hypertensive heart and chronic kidney disease with heart failure and stage 1 through stage 4 chronic kidney disease, or unspecified chronic kidney disease: Secondary | ICD-10-CM | POA: Diagnosis not present

## 2024-03-05 DIAGNOSIS — I5032 Chronic diastolic (congestive) heart failure: Secondary | ICD-10-CM | POA: Diagnosis not present

## 2024-03-05 DIAGNOSIS — E039 Hypothyroidism, unspecified: Secondary | ICD-10-CM | POA: Diagnosis not present

## 2024-03-05 DIAGNOSIS — I10 Essential (primary) hypertension: Secondary | ICD-10-CM | POA: Diagnosis not present

## 2024-03-05 DIAGNOSIS — R251 Tremor, unspecified: Secondary | ICD-10-CM | POA: Diagnosis not present

## 2024-03-08 DIAGNOSIS — M5412 Radiculopathy, cervical region: Secondary | ICD-10-CM | POA: Diagnosis not present

## 2024-03-08 LAB — CYTOLOGY - PAP: Adequacy: ABSENT

## 2024-03-28 ENCOUNTER — Other Ambulatory Visit: Payer: Self-pay | Admitting: Neurology

## 2024-03-28 ENCOUNTER — Other Ambulatory Visit: Payer: Self-pay | Admitting: Adult Health

## 2024-03-28 NOTE — Telephone Encounter (Signed)
 Last seen on 02/01/23 Follow up scheduled 05/08/24

## 2024-03-29 NOTE — Telephone Encounter (Signed)
 Last seen on 02/01/24 Follow up scheduled on 05/08/24   Dispensed Days Supply Quantity Provider Pharmacy  CLONAZEPAM  0.5MG     TAB 03/01/2024 30 30 each Johny Nap, NP [facility] [phone]      Rx pending to be signed

## 2024-03-29 NOTE — Telephone Encounter (Signed)
 Patient only takes 0.5 tab BID PRN, quantity adjusted to 30 tablets. Thank you.

## 2024-03-30 ENCOUNTER — Encounter (HOSPITAL_COMMUNITY): Payer: Self-pay | Admitting: Family Medicine

## 2024-03-30 DIAGNOSIS — M5412 Radiculopathy, cervical region: Secondary | ICD-10-CM | POA: Diagnosis not present

## 2024-03-30 DIAGNOSIS — M545 Low back pain, unspecified: Secondary | ICD-10-CM | POA: Diagnosis not present

## 2024-04-04 ENCOUNTER — Other Ambulatory Visit (HOSPITAL_COMMUNITY): Payer: Self-pay | Admitting: Family Medicine

## 2024-04-04 ENCOUNTER — Encounter (HOSPITAL_COMMUNITY): Payer: Self-pay | Admitting: Family Medicine

## 2024-04-04 DIAGNOSIS — Z8041 Family history of malignant neoplasm of ovary: Secondary | ICD-10-CM

## 2024-04-04 DIAGNOSIS — R262 Difficulty in walking, not elsewhere classified: Secondary | ICD-10-CM | POA: Diagnosis not present

## 2024-04-04 DIAGNOSIS — M5416 Radiculopathy, lumbar region: Secondary | ICD-10-CM | POA: Diagnosis not present

## 2024-04-04 DIAGNOSIS — M62551 Muscle wasting and atrophy, not elsewhere classified, right thigh: Secondary | ICD-10-CM | POA: Diagnosis not present

## 2024-04-10 DIAGNOSIS — M5416 Radiculopathy, lumbar region: Secondary | ICD-10-CM | POA: Diagnosis not present

## 2024-04-10 DIAGNOSIS — R262 Difficulty in walking, not elsewhere classified: Secondary | ICD-10-CM | POA: Diagnosis not present

## 2024-04-10 DIAGNOSIS — M62551 Muscle wasting and atrophy, not elsewhere classified, right thigh: Secondary | ICD-10-CM | POA: Diagnosis not present

## 2024-04-12 ENCOUNTER — Ambulatory Visit (HOSPITAL_COMMUNITY)
Admission: RE | Admit: 2024-04-12 | Discharge: 2024-04-12 | Disposition: A | Discharge status: Home / Self Care | Source: Ambulatory Visit | Attending: Family Medicine | Admitting: Family Medicine

## 2024-04-12 DIAGNOSIS — Z8041 Family history of malignant neoplasm of ovary: Secondary | ICD-10-CM | POA: Diagnosis not present

## 2024-04-12 DIAGNOSIS — R9389 Abnormal findings on diagnostic imaging of other specified body structures: Secondary | ICD-10-CM | POA: Diagnosis not present

## 2024-04-12 DIAGNOSIS — M62551 Muscle wasting and atrophy, not elsewhere classified, right thigh: Secondary | ICD-10-CM | POA: Diagnosis not present

## 2024-04-12 DIAGNOSIS — R262 Difficulty in walking, not elsewhere classified: Secondary | ICD-10-CM | POA: Diagnosis not present

## 2024-04-12 DIAGNOSIS — Z1273 Encounter for screening for malignant neoplasm of ovary: Secondary | ICD-10-CM | POA: Diagnosis not present

## 2024-04-12 DIAGNOSIS — M5416 Radiculopathy, lumbar region: Secondary | ICD-10-CM | POA: Diagnosis not present

## 2024-04-18 DIAGNOSIS — M62551 Muscle wasting and atrophy, not elsewhere classified, right thigh: Secondary | ICD-10-CM | POA: Diagnosis not present

## 2024-04-18 DIAGNOSIS — M5416 Radiculopathy, lumbar region: Secondary | ICD-10-CM | POA: Diagnosis not present

## 2024-04-18 DIAGNOSIS — R262 Difficulty in walking, not elsewhere classified: Secondary | ICD-10-CM | POA: Diagnosis not present

## 2024-04-20 DIAGNOSIS — M5416 Radiculopathy, lumbar region: Secondary | ICD-10-CM | POA: Diagnosis not present

## 2024-04-20 DIAGNOSIS — R262 Difficulty in walking, not elsewhere classified: Secondary | ICD-10-CM | POA: Diagnosis not present

## 2024-04-20 DIAGNOSIS — M62551 Muscle wasting and atrophy, not elsewhere classified, right thigh: Secondary | ICD-10-CM | POA: Diagnosis not present

## 2024-04-24 DIAGNOSIS — M62551 Muscle wasting and atrophy, not elsewhere classified, right thigh: Secondary | ICD-10-CM | POA: Diagnosis not present

## 2024-04-24 DIAGNOSIS — R262 Difficulty in walking, not elsewhere classified: Secondary | ICD-10-CM | POA: Diagnosis not present

## 2024-04-24 DIAGNOSIS — M5416 Radiculopathy, lumbar region: Secondary | ICD-10-CM | POA: Diagnosis not present

## 2024-04-26 DIAGNOSIS — M62551 Muscle wasting and atrophy, not elsewhere classified, right thigh: Secondary | ICD-10-CM | POA: Diagnosis not present

## 2024-04-26 DIAGNOSIS — R262 Difficulty in walking, not elsewhere classified: Secondary | ICD-10-CM | POA: Diagnosis not present

## 2024-04-26 DIAGNOSIS — M5416 Radiculopathy, lumbar region: Secondary | ICD-10-CM | POA: Diagnosis not present

## 2024-05-01 DIAGNOSIS — R262 Difficulty in walking, not elsewhere classified: Secondary | ICD-10-CM | POA: Diagnosis not present

## 2024-05-01 DIAGNOSIS — M62551 Muscle wasting and atrophy, not elsewhere classified, right thigh: Secondary | ICD-10-CM | POA: Diagnosis not present

## 2024-05-01 DIAGNOSIS — M5416 Radiculopathy, lumbar region: Secondary | ICD-10-CM | POA: Diagnosis not present

## 2024-05-03 ENCOUNTER — Ambulatory Visit: Admitting: Neurology

## 2024-05-03 DIAGNOSIS — R262 Difficulty in walking, not elsewhere classified: Secondary | ICD-10-CM | POA: Diagnosis not present

## 2024-05-03 DIAGNOSIS — M62551 Muscle wasting and atrophy, not elsewhere classified, right thigh: Secondary | ICD-10-CM | POA: Diagnosis not present

## 2024-05-03 DIAGNOSIS — M5416 Radiculopathy, lumbar region: Secondary | ICD-10-CM | POA: Diagnosis not present

## 2024-05-07 ENCOUNTER — Telehealth: Payer: Self-pay | Admitting: Neurology

## 2024-05-07 NOTE — Telephone Encounter (Signed)
Patient called to verify appointment 

## 2024-05-08 ENCOUNTER — Ambulatory Visit (INDEPENDENT_AMBULATORY_CARE_PROVIDER_SITE_OTHER): Admitting: Neurology

## 2024-05-08 ENCOUNTER — Encounter: Payer: Self-pay | Admitting: Neurology

## 2024-05-08 VITALS — BP 129/80 | HR 82 | Ht 59.0 in | Wt 193.0 lb

## 2024-05-08 DIAGNOSIS — M62551 Muscle wasting and atrophy, not elsewhere classified, right thigh: Secondary | ICD-10-CM | POA: Diagnosis not present

## 2024-05-08 DIAGNOSIS — R269 Unspecified abnormalities of gait and mobility: Secondary | ICD-10-CM

## 2024-05-08 DIAGNOSIS — R251 Tremor, unspecified: Secondary | ICD-10-CM | POA: Diagnosis not present

## 2024-05-08 DIAGNOSIS — R202 Paresthesia of skin: Secondary | ICD-10-CM | POA: Insufficient documentation

## 2024-05-08 DIAGNOSIS — M5416 Radiculopathy, lumbar region: Secondary | ICD-10-CM | POA: Diagnosis not present

## 2024-05-08 DIAGNOSIS — R262 Difficulty in walking, not elsewhere classified: Secondary | ICD-10-CM | POA: Diagnosis not present

## 2024-05-08 MED ORDER — GABAPENTIN 300 MG PO CAPS: 300.0000 mg | ORAL_CAPSULE | Freq: Three times a day (TID) | ORAL | 11 refills | Status: AC

## 2024-05-08 NOTE — Progress Notes (Signed)
 Chief Complaint  Patient presents with   Tremors    Rm 15 alone  Pt is well, reports her tremor and balance has slightly improved since last visit but she still has some concerns. She is doing PT.  No other concerns.       ASSESSMENT AND PLAN  Rebekah Ray is a 68 y.o. female   Uncontrollable bilateral lower extremity shaking when bearing weight,  MRI of cervical spine showed mild degenerative disease no evidence of cord compression,  Higher dose of gabapentin  900 mg daily, maximum daily dose due to her kidney disease GFR of 30, did help calm her down,  Continue PT Report acute onset of left-sided numbness since 2024,  Multiple vascular risk factors, hypertension, diabetes, hyperlipidemia, obesity sedentary lifestyle,  MRI of the brain to rule out right hemisphere pathology  DIAGNOSTIC DATA (LABS, IMAGING, TESTING) - I reviewed patient records, labs, notes, testing and imaging myself where available.   MEDICAL HISTORY:  Rebekah Ray is a 68 year old female, seen in request by [facility] Dr. Leigh Lung, for evaluation of gait abnormality, initial evaluation July 05, 2023  I reviewed and summarized the referring note. PMHX DM since 2000 Hypothyrodism HLD HTN  She reported trouble walking bilateral leg shaking since 2022, initially only intermittent, she contributed to her low back pain, hip pain, also planta fasciitis of bilateral feet, but her symptoms gradually getting worse, she now works at [employer], requires standing walking for extended period of time, after standing for a while, she began to have uncontrollable large amplitude leg shaking, I was able to weakness her leg shaking when standing and while walking, large amplitude, involving both leg, but there was no interruption of her balance  Few months ago she was put on gabapentin  now taking 100 mg 2 tablets twice a day, she tolerated well, no excessive drowsiness fatigue, helped her symptoms 50%,  She denies  symptoms while sitting or trying to sleep,   Laboratory values from August 2024, normal thyroid  functional test, glucose 173, creatinine 1.86, GFR of 29,  UPDATE May 08 2024:   MRI of the cervical spine for evaluation of gait abnormality in 2024 showed multiple degenerative changes no significant canal foraminal narrowing   The spinal cord appears normal. At C5-C6, there is borderline spinal stenosis and mild to moderate foraminal narrowing but no nerve root compression. At C6-C7, there is mild spinal stenosis and moderate bilateral foraminal narrowing.  There does not appear to be nerve root compression. Milder degenerative changes at C4-C5 and C7-T1 do not lead to spinal stenosis or nerve root compression. Higher dose of gabapentin  gabapentin  300mg  tid, did calm her down, physical therapy was also helpful, she retired, lives alone, sedentary lifestyle,  Now complains of left side numbness involving left upper and lower extremity since summer 2024, persistent since its onset, also contributing to her gait abnormality,   PHYSICAL EXAM:   Vitals:   05/08/24 0947  BP: 129/80  Pulse: 82  Weight: 193 lb (87.5 kg)  Height: 4' 11 (1.499 m)      Body mass index is 38.98 kg/m.  PHYSICAL EXAMNIATION:  Gen: NAD, conversant, well nourised, well groomed                     Cardiovascular: Regular rate rhythm, no peripheral edema, warm, nontender. Eyes: Conjunctivae clear without exudates or hemorrhage Neck: Supple, no carotid bruits. Pulmonary: Clear to auscultation bilaterally   NEUROLOGICAL EXAM:  MENTAL STATUS: Speech/cognition: Depressed  looking elderly female, awake, alert, oriented to history taking and casual conversation CRANIAL NERVES: CN II: Visual fields are full to confrontation. Pupils are round equal and briskly reactive to light. CN III, IV, VI: extraocular movement are normal. No ptosis. CN V: Facial sensation is intact to light touch CN VII: Face is symmetric  with normal eye closure  CN VIII: Hearing is normal to causal conversation. CN IX, X: Phonation is normal. CN XI: Head turning and shoulder shrug are intact  MOTOR: Mild left arm drift, fixation on rapid rotating movement  REFLEXES: Reflexes are 2+ and symmetric at the biceps, triceps, knees, and ankles. Plantar responses are flexor.  SENSORY: Intact to light touch, pinprick and vibratory sensation are intact in fingers and toes.  COORDINATION: There is no trunk or limb dysmetria noted.  GAIT/STANCE: Push-up to get up from seated position, after standing for a while, she began to have bilateral lower extremity shaking, continued while she walking, but able to keep balance.  REVIEW OF SYSTEMS:  Full 14 system review of systems performed and notable only for as above All other review of systems were negative.   ALLERGIES: Allergies  Allergen Reactions   Benadryl [Diphenhydramine]     Makes heart race   Norvasc  [Amlodipine  Besylate]     edema    HOME MEDICATIONS: Current Outpatient Medications  Medication Sig Dispense Refill   acetaminophen -codeine (TYLENOL  #3) 300-30 MG tablet Take 1 tablet by mouth every 6 (six) hours as needed.     aspirin EC 81 MG tablet Take 81 mg by mouth daily.     atorvastatin  (LIPITOR) 40 MG tablet Take 1 tablet (40 mg total) by mouth daily. Keep upcoming appointment for future refills. 90 tablet 1   carvedilol  (COREG ) 25 MG tablet Take 1 tablet (25 mg total) by mouth 2 (two) times daily with a meal. 180 tablet 1   clonazePAM  (KLONOPIN ) 0.5 MG tablet TAKE 1/2 (ONE-HALF) TABLET BY MOUTH TWICE DAILY AS NEEDED 30 tablet 0   Continuous Glucose Sensor (FREESTYLE LIBRE 3 PLUS SENSOR) MISC SMARTSIG:1 Topical     ferrous sulfate 325 (65 FE) MG tablet Take 162.5 mg by mouth 2 (two) times a week.     furosemide  (LASIX ) 40 MG tablet Take 40 mg by mouth 2 (two) times daily.     gabapentin  (NEURONTIN ) 300 MG capsule TAKE 1 CAPSULE(300 MG) BY MOUTH THREE TIMES  DAILY 90 capsule 1   HUMALOG KWIKPEN 100 UNIT/ML KwikPen USE SLIDING SCALE AS GIVEN THREE TIMES DAILY WITH EACH MEAL, NOT TO EXCEED 100 UNITS PER DAY     hydrALAZINE  (APRESOLINE ) 50 MG tablet Take 1 tablet (50 mg total) by mouth every 8 (eight) hours. (Patient taking differently: Take 100 mg by mouth in the morning and at bedtime.) 90 tablet 0   JARDIANCE 10 MG TABS tablet Take 10 mg by mouth daily.     levothyroxine (SYNTHROID, LEVOTHROID) 50 MCG tablet Take 50 mcg by mouth daily before breakfast.     Multiple Vitamin (MULTIVITAMIN) tablet Take 1 tablet by mouth daily.     Multiple Vitamins-Minerals (MULTIVITAMIN WITH MINERALS) tablet Take 1 tablet by mouth daily.     Semaglutide,0.25 or 0.5MG /DOS, (OZEMPIC, 0.25 OR 0.5 MG/DOSE,) 2 MG/1.5ML SOPN Inject 0.5 mg into the skin every 7 (seven) days. (Patient taking differently: Inject 2 mg into the skin every 7 (seven) days.)     sitaGLIPtin (JANUVIA) 25 MG tablet Take 25 mg by mouth daily.     spironolactone (ALDACTONE) 25  MG tablet Take 25 mg by mouth daily.     TOUJEO MAX SOLOSTAR 300 UNIT/ML Solostar Pen Inject 35 Units into the skin daily.     No current facility-administered medications for this visit.    PAST MEDICAL HISTORY: Past Medical History:  Diagnosis Date   COPD (chronic obstructive pulmonary disease) (HCC)    Diabetes mellitus without complication (HCC)    Hyperlipidemia    Hypertension    Renal disorder    Thyroid  disease     PAST SURGICAL HISTORY: Past Surgical History:  Procedure Laterality Date   BREAST BIOPSY Right 1985   benign   CESAREAN SECTION     COLONOSCOPY WITH PROPOFOL  N/A 11/11/2020   non-bleeding internal hemorrhoids, few small-mouthed diverticula, 2 mm polyp in ascending, 4 mm polyp in transverse. Tubular adenoma. Surveillance 5 years.    FOOT SURGERY     POLYPECTOMY  11/11/2020   Procedure: POLYPECTOMY;  Surgeon: Cindie Carlin POUR, DO;  Location: [facility];  Service: Endoscopy;;   TUBAL LIGATION       FAMILY HISTORY: Family History  Problem Relation Age of Onset   Ovarian cancer Mother    Kidney disease Mother    Heart Problems Mother    Heart attack Mother    Kidney disease Father    Diabetes Father    Heart attack Father    Stroke Brother     SOCIAL HISTORY: Social History   Socioeconomic History   Marital status: Divorced    Spouse name: Not on file   Number of children: 2   Years of education: Not on file   Highest education level: Not on file  Occupational History   Occupation: Front Counter  Tobacco Use   Smoking status: Former    Current packs/day: 0.00    Average packs/day: 2.0 packs/day for 42.0 years (84.0 ttl pk-yrs)    Types: Cigarettes    Start date: 01/23/1972    Quit date: 01/22/2014    Years since quitting: 10.2   Smokeless tobacco: Former  Substance and Sexual Activity   Alcohol use: No   Drug use: No   Sexual activity: Never  Other Topics Concern   Not on file  Social History Narrative   Not on file   Social Drivers of Health   Financial Resource Strain: Not on file  Food Insecurity: Not on file  Transportation Needs: Not on file  Physical Activity: Not on file  Stress: Not on file  Social Connections: Not on file  Intimate Partner Violence: Not on file      Modena Callander, M.D. Ph.D.  [facility] [address] [place], [place] [address] Ph: [phone] Fax: [phone]  CC:  Leigh Lung, MD [address] [place],  [place] [address]  Leigh Lung, MD

## 2024-05-09 ENCOUNTER — Telehealth: Payer: Self-pay | Admitting: Neurology

## 2024-05-09 NOTE — Telephone Encounter (Signed)
 Rebekah Ray: [id number] exp. 05/09/24-07/08/24 Sent to [facility] for an open MRI per order. [phone]

## 2024-05-10 DIAGNOSIS — M5416 Radiculopathy, lumbar region: Secondary | ICD-10-CM | POA: Diagnosis not present

## 2024-05-10 DIAGNOSIS — R262 Difficulty in walking, not elsewhere classified: Secondary | ICD-10-CM | POA: Diagnosis not present

## 2024-05-10 DIAGNOSIS — M62551 Muscle wasting and atrophy, not elsewhere classified, right thigh: Secondary | ICD-10-CM | POA: Diagnosis not present

## 2024-05-15 DIAGNOSIS — M5412 Radiculopathy, cervical region: Secondary | ICD-10-CM | POA: Diagnosis not present

## 2024-05-15 DIAGNOSIS — M48061 Spinal stenosis, lumbar region without neurogenic claudication: Secondary | ICD-10-CM | POA: Diagnosis not present

## 2024-05-16 DIAGNOSIS — M5416 Radiculopathy, lumbar region: Secondary | ICD-10-CM | POA: Diagnosis not present

## 2024-05-16 DIAGNOSIS — M62551 Muscle wasting and atrophy, not elsewhere classified, right thigh: Secondary | ICD-10-CM | POA: Diagnosis not present

## 2024-05-16 DIAGNOSIS — R262 Difficulty in walking, not elsewhere classified: Secondary | ICD-10-CM | POA: Diagnosis not present

## 2024-05-17 DIAGNOSIS — N390 Urinary tract infection, site not specified: Secondary | ICD-10-CM | POA: Diagnosis not present

## 2024-05-17 DIAGNOSIS — E1122 Type 2 diabetes mellitus with diabetic chronic kidney disease: Secondary | ICD-10-CM | POA: Diagnosis not present

## 2024-05-17 DIAGNOSIS — D631 Anemia in chronic kidney disease: Secondary | ICD-10-CM | POA: Diagnosis not present

## 2024-05-17 DIAGNOSIS — N184 Chronic kidney disease, stage 4 (severe): Secondary | ICD-10-CM | POA: Diagnosis not present

## 2024-05-17 DIAGNOSIS — Z6841 Body Mass Index (BMI) 40.0 and over, adult: Secondary | ICD-10-CM | POA: Diagnosis not present

## 2024-05-17 DIAGNOSIS — I129 Hypertensive chronic kidney disease with stage 1 through stage 4 chronic kidney disease, or unspecified chronic kidney disease: Secondary | ICD-10-CM | POA: Diagnosis not present

## 2024-05-17 DIAGNOSIS — J449 Chronic obstructive pulmonary disease, unspecified: Secondary | ICD-10-CM | POA: Diagnosis not present

## 2024-05-17 DIAGNOSIS — R799 Abnormal finding of blood chemistry, unspecified: Secondary | ICD-10-CM | POA: Diagnosis not present

## 2024-05-18 DIAGNOSIS — R262 Difficulty in walking, not elsewhere classified: Secondary | ICD-10-CM | POA: Diagnosis not present

## 2024-05-18 DIAGNOSIS — M62551 Muscle wasting and atrophy, not elsewhere classified, right thigh: Secondary | ICD-10-CM | POA: Diagnosis not present

## 2024-05-18 DIAGNOSIS — M5416 Radiculopathy, lumbar region: Secondary | ICD-10-CM | POA: Diagnosis not present

## 2024-05-22 DIAGNOSIS — M5416 Radiculopathy, lumbar region: Secondary | ICD-10-CM | POA: Diagnosis not present

## 2024-05-22 DIAGNOSIS — M62551 Muscle wasting and atrophy, not elsewhere classified, right thigh: Secondary | ICD-10-CM | POA: Diagnosis not present

## 2024-05-22 DIAGNOSIS — R262 Difficulty in walking, not elsewhere classified: Secondary | ICD-10-CM | POA: Diagnosis not present

## 2024-05-24 ENCOUNTER — Other Ambulatory Visit: Payer: Self-pay | Admitting: Adult Health

## 2024-05-24 NOTE — Telephone Encounter (Signed)
 Pt was last seen 7/1, last filled 5/22 30tab. No upcoming appt scheduled. Do you agree to refill?

## 2024-05-25 DIAGNOSIS — R262 Difficulty in walking, not elsewhere classified: Secondary | ICD-10-CM | POA: Diagnosis not present

## 2024-05-25 DIAGNOSIS — M62551 Muscle wasting and atrophy, not elsewhere classified, right thigh: Secondary | ICD-10-CM | POA: Diagnosis not present

## 2024-05-25 DIAGNOSIS — M5416 Radiculopathy, lumbar region: Secondary | ICD-10-CM | POA: Diagnosis not present

## 2024-05-28 NOTE — Telephone Encounter (Signed)
 [facility] said she is scheduled for 7/31 at 11:15am

## 2024-05-28 NOTE — Telephone Encounter (Signed)
 She left me a voice mail that she hasn't heard from anyone to schedule the MRI yet. I sent an email to [facility] asking them to call her again.

## 2024-05-29 DIAGNOSIS — M62551 Muscle wasting and atrophy, not elsewhere classified, right thigh: Secondary | ICD-10-CM | POA: Diagnosis not present

## 2024-05-29 DIAGNOSIS — R262 Difficulty in walking, not elsewhere classified: Secondary | ICD-10-CM | POA: Diagnosis not present

## 2024-05-29 DIAGNOSIS — M5416 Radiculopathy, lumbar region: Secondary | ICD-10-CM | POA: Diagnosis not present

## 2024-06-01 DIAGNOSIS — R262 Difficulty in walking, not elsewhere classified: Secondary | ICD-10-CM | POA: Diagnosis not present

## 2024-06-01 DIAGNOSIS — M62551 Muscle wasting and atrophy, not elsewhere classified, right thigh: Secondary | ICD-10-CM | POA: Diagnosis not present

## 2024-06-01 DIAGNOSIS — M5416 Radiculopathy, lumbar region: Secondary | ICD-10-CM | POA: Diagnosis not present

## 2024-06-04 DIAGNOSIS — I130 Hypertensive heart and chronic kidney disease with heart failure and stage 1 through stage 4 chronic kidney disease, or unspecified chronic kidney disease: Secondary | ICD-10-CM | POA: Diagnosis not present

## 2024-06-04 DIAGNOSIS — R251 Tremor, unspecified: Secondary | ICD-10-CM | POA: Diagnosis not present

## 2024-06-04 DIAGNOSIS — J449 Chronic obstructive pulmonary disease, unspecified: Secondary | ICD-10-CM | POA: Diagnosis not present

## 2024-06-04 DIAGNOSIS — I10 Essential (primary) hypertension: Secondary | ICD-10-CM | POA: Diagnosis not present

## 2024-06-04 DIAGNOSIS — E039 Hypothyroidism, unspecified: Secondary | ICD-10-CM | POA: Diagnosis not present

## 2024-06-04 DIAGNOSIS — M184 Other bilateral secondary osteoarthritis of first carpometacarpal joints: Secondary | ICD-10-CM | POA: Diagnosis not present

## 2024-06-04 DIAGNOSIS — I5032 Chronic diastolic (congestive) heart failure: Secondary | ICD-10-CM | POA: Diagnosis not present

## 2024-06-04 DIAGNOSIS — E1122 Type 2 diabetes mellitus with diabetic chronic kidney disease: Secondary | ICD-10-CM | POA: Diagnosis not present

## 2024-06-04 DIAGNOSIS — N184 Chronic kidney disease, stage 4 (severe): Secondary | ICD-10-CM | POA: Diagnosis not present

## 2024-06-05 DIAGNOSIS — M5416 Radiculopathy, lumbar region: Secondary | ICD-10-CM | POA: Diagnosis not present

## 2024-06-05 DIAGNOSIS — M62551 Muscle wasting and atrophy, not elsewhere classified, right thigh: Secondary | ICD-10-CM | POA: Diagnosis not present

## 2024-06-05 DIAGNOSIS — R262 Difficulty in walking, not elsewhere classified: Secondary | ICD-10-CM | POA: Diagnosis not present

## 2024-06-07 DIAGNOSIS — I639 Cerebral infarction, unspecified: Secondary | ICD-10-CM | POA: Diagnosis not present

## 2024-06-07 DIAGNOSIS — M62551 Muscle wasting and atrophy, not elsewhere classified, right thigh: Secondary | ICD-10-CM | POA: Diagnosis not present

## 2024-06-07 DIAGNOSIS — R262 Difficulty in walking, not elsewhere classified: Secondary | ICD-10-CM | POA: Diagnosis not present

## 2024-06-07 DIAGNOSIS — G9389 Other specified disorders of brain: Secondary | ICD-10-CM | POA: Diagnosis not present

## 2024-06-07 DIAGNOSIS — M5416 Radiculopathy, lumbar region: Secondary | ICD-10-CM | POA: Diagnosis not present

## 2024-06-12 DIAGNOSIS — R262 Difficulty in walking, not elsewhere classified: Secondary | ICD-10-CM | POA: Diagnosis not present

## 2024-06-12 DIAGNOSIS — M5416 Radiculopathy, lumbar region: Secondary | ICD-10-CM | POA: Diagnosis not present

## 2024-06-12 DIAGNOSIS — M62551 Muscle wasting and atrophy, not elsewhere classified, right thigh: Secondary | ICD-10-CM | POA: Diagnosis not present

## 2024-06-13 DIAGNOSIS — M48061 Spinal stenosis, lumbar region without neurogenic claudication: Secondary | ICD-10-CM | POA: Diagnosis not present

## 2024-06-13 NOTE — Telephone Encounter (Signed)
 I ordered MRI brain on July 1st, I did not see the MRI?  I would prefer her to stay on lower dose clonazepam , which is an anxiolytics agent, can be habit forming  If she has anxiety, she needs to work with her PCP

## 2024-06-14 ENCOUNTER — Telehealth: Payer: Self-pay

## 2024-06-14 DIAGNOSIS — M5416 Radiculopathy, lumbar region: Secondary | ICD-10-CM | POA: Diagnosis not present

## 2024-06-14 DIAGNOSIS — R262 Difficulty in walking, not elsewhere classified: Secondary | ICD-10-CM | POA: Diagnosis not present

## 2024-06-14 DIAGNOSIS — M62551 Muscle wasting and atrophy, not elsewhere classified, right thigh: Secondary | ICD-10-CM | POA: Diagnosis not present

## 2024-06-14 NOTE — Progress Notes (Signed)
   06/14/2024  Patient ID: Rebekah Ray, female   DOB: 08/22/1956, 68 y.o.   MRN: [id number]  Contacted patient to perform a medication assistance evaluation, she was enrolled in multiple patient assistance programs (PAPs) in previous years. The patient informed me that she in now dual eligible and does not have financial barriers to her medications. Per DrFirst, she is adherent to her chronic meds, and her A1c is almost at goal at 7.7% in July 2025. The patient declined pharmacy services at this time.   I will follow up in 2 months to re-evaluate.   Heather Factor, PharmD Clinical Pharmacist  [phone]

## 2024-06-19 DIAGNOSIS — R262 Difficulty in walking, not elsewhere classified: Secondary | ICD-10-CM | POA: Diagnosis not present

## 2024-06-19 DIAGNOSIS — M62551 Muscle wasting and atrophy, not elsewhere classified, right thigh: Secondary | ICD-10-CM | POA: Diagnosis not present

## 2024-06-19 DIAGNOSIS — M5416 Radiculopathy, lumbar region: Secondary | ICD-10-CM | POA: Diagnosis not present

## 2024-06-20 ENCOUNTER — Telehealth: Payer: Self-pay | Admitting: Neurology

## 2024-06-20 NOTE — Telephone Encounter (Signed)
 Jori from [facility] called to follow up about fax that was sent to MD  about MRI Results. Jori was calling to se if MD receive this fax .  Callback # [phone]

## 2024-06-21 ENCOUNTER — Telehealth: Payer: Self-pay | Admitting: *Deleted

## 2024-06-21 ENCOUNTER — Other Ambulatory Visit: Payer: Self-pay | Admitting: Neurology

## 2024-06-21 DIAGNOSIS — M5416 Radiculopathy, lumbar region: Secondary | ICD-10-CM | POA: Diagnosis not present

## 2024-06-21 DIAGNOSIS — R262 Difficulty in walking, not elsewhere classified: Secondary | ICD-10-CM | POA: Diagnosis not present

## 2024-06-21 DIAGNOSIS — M62551 Muscle wasting and atrophy, not elsewhere classified, right thigh: Secondary | ICD-10-CM | POA: Diagnosis not present

## 2024-06-21 NOTE — Telephone Encounter (Signed)
 Meds ordered this encounter  Medications   clonazePAM  (KLONOPIN ) 0.5 MG tablet    Sig: TAKE 1/2 (ONE-HALF) TABLET BY MOUTH TWICE DAILY AS NEEDED    Dispense:  15 tablet    Refill:  0     I give patient low dose clonazepam  for her complaints of lower extremity shaking, difficulty bearing weight, fleeting complaints and signs, variable effort on examination, I refilled her low-dose clonazepam , 15 tablets as needed, will not continue refill after this.

## 2024-06-21 NOTE — Telephone Encounter (Signed)
 Report r/c from [facility] in Dr Onita pod.

## 2024-06-26 ENCOUNTER — Telehealth: Payer: Self-pay | Admitting: Neurology

## 2024-06-26 DIAGNOSIS — R262 Difficulty in walking, not elsewhere classified: Secondary | ICD-10-CM | POA: Diagnosis not present

## 2024-06-26 DIAGNOSIS — R202 Paresthesia of skin: Secondary | ICD-10-CM

## 2024-06-26 DIAGNOSIS — D329 Benign neoplasm of meninges, unspecified: Secondary | ICD-10-CM | POA: Insufficient documentation

## 2024-06-26 DIAGNOSIS — R251 Tremor, unspecified: Secondary | ICD-10-CM

## 2024-06-26 DIAGNOSIS — M62551 Muscle wasting and atrophy, not elsewhere classified, right thigh: Secondary | ICD-10-CM | POA: Diagnosis not present

## 2024-06-26 DIAGNOSIS — M5416 Radiculopathy, lumbar region: Secondary | ICD-10-CM | POA: Diagnosis not present

## 2024-06-26 DIAGNOSIS — R269 Unspecified abnormalities of gait and mobility: Secondary | ICD-10-CM

## 2024-06-26 NOTE — Telephone Encounter (Signed)
 Please call patient, MRI brain showed no acute abnormality, incidental finding of small meningioma, will follow up in one year.   Give her a follow up visit with NP in one year, remind patient  to call for MRI prior to next visit   1. Approximately 14 mm mass along the superior margin of the medial right tentorium cerebelli, probably extra-axial. Statistically, this is most likely a meningioma. Follow-up MRI of the brain without and with IV contrast could be obtained for further evaluation.   2. No acute intracranial abnormality.

## 2024-06-27 NOTE — Telephone Encounter (Signed)
 Please let patient know, clonazepam  benign to benzodiazepine category medicine, had high chance to become dependent on it, I will not continue to refill her clonazepam ,  May consider her primary care if needed

## 2024-06-27 NOTE — Telephone Encounter (Signed)
 Called and result given, pt scheduled with np mccue for 1 yr as md requested. Pt has inquired on why the clonazepam  has decreased. Will defer to provider

## 2024-06-27 NOTE — Telephone Encounter (Signed)
 Call to patient, advised Dr. Onita would no longer be refilling her Klonopin . Advised her to contact her PCP for future refills.   Call to pharmacy and advised to not send refill requests to us  for klonopin , spoke with Donnie.  Medication list reconciled

## 2024-06-27 NOTE — Addendum Note (Signed)
 Addended by: JOSHUA IZETTA CROME on: 06/27/2024 10:07 AM   Modules accepted: Orders

## 2024-06-28 DIAGNOSIS — M25551 Pain in right hip: Secondary | ICD-10-CM | POA: Diagnosis not present

## 2024-06-28 DIAGNOSIS — M5416 Radiculopathy, lumbar region: Secondary | ICD-10-CM | POA: Diagnosis not present

## 2024-06-28 DIAGNOSIS — R262 Difficulty in walking, not elsewhere classified: Secondary | ICD-10-CM | POA: Diagnosis not present

## 2024-06-28 DIAGNOSIS — M5412 Radiculopathy, cervical region: Secondary | ICD-10-CM | POA: Diagnosis not present

## 2024-06-28 DIAGNOSIS — M62551 Muscle wasting and atrophy, not elsewhere classified, right thigh: Secondary | ICD-10-CM | POA: Diagnosis not present

## 2024-07-03 DIAGNOSIS — M62551 Muscle wasting and atrophy, not elsewhere classified, right thigh: Secondary | ICD-10-CM | POA: Diagnosis not present

## 2024-07-03 DIAGNOSIS — M5416 Radiculopathy, lumbar region: Secondary | ICD-10-CM | POA: Diagnosis not present

## 2024-07-03 DIAGNOSIS — R262 Difficulty in walking, not elsewhere classified: Secondary | ICD-10-CM | POA: Diagnosis not present

## 2024-07-04 ENCOUNTER — Encounter: Payer: Self-pay | Admitting: Women's Health

## 2024-07-04 ENCOUNTER — Other Ambulatory Visit (HOSPITAL_COMMUNITY)
Admission: RE | Admit: 2024-07-04 | Discharge: 2024-07-04 | Disposition: A | Discharge status: Home / Self Care | Source: Ambulatory Visit | Attending: Women's Health | Admitting: Women's Health

## 2024-07-04 ENCOUNTER — Ambulatory Visit: Admitting: Women's Health

## 2024-07-04 VITALS — BP 112/73 | HR 83 | Ht 59.0 in | Wt 198.4 lb

## 2024-07-04 DIAGNOSIS — N870 Mild cervical dysplasia: Secondary | ICD-10-CM | POA: Diagnosis not present

## 2024-07-04 DIAGNOSIS — R87612 Low grade squamous intraepithelial lesion on cytologic smear of cervix (LGSIL): Secondary | ICD-10-CM | POA: Diagnosis not present

## 2024-07-04 DIAGNOSIS — R87619 Unspecified abnormal cytological findings in specimens from cervix uteri: Secondary | ICD-10-CM | POA: Insufficient documentation

## 2024-07-04 NOTE — Patient Instructions (Signed)
 Colposcopy, Care After  The following information offers guidance on how to care for yourself after your procedure. Your health care provider may also give you more specific instructions. If you have problems or questions, contact your health care provider. What can I expect after the procedure? If you had a colposcopy without a biopsy, you can expect to feel fine right away after your procedure. However, you may have some spotting of blood for a few days. You can return to your normal activities. If you had a colposcopy with a biopsy, it is common after the procedure to have: Soreness and mild pain. These may last for a few days. Mild vaginal bleeding or discharge that is dark-colored and grainy. This may last for a few days. The discharge may be caused by a liquid (solution) that was used during the procedure. You may need to wear a sanitary pad during this time. Spotting of blood for at least 48 hours after the procedure. Follow these instructions at home: Medicines Take over-the-counter and prescription medicines only as told by your health care provider. Talk with your health care provider about what type of over-the-counter pain medicines and prescription medicines you can start to take again. It is especially important to talk with your health care provider if you take blood thinners. Activity Avoid using douche products, using tampons, and having sex for at least 3 days after the procedure or for as long as told by your health care provider. Return to your normal activities as told by your health care provider. Ask your health care provider what activities are safe for you. General instructions Ask your health care provider if you may take baths, swim, or use a hot tub. You may take showers. If you use birth control (contraception), continue to use it. Keep all follow-up visits. This is important. Contact a health care provider if: You have a fever or chills. You faint or feel  light-headed. Get help right away if: You have heavy bleeding from your vagina or pass blood clots. Heavy bleeding is bleeding that soaks through a sanitary pad in less than 1 hour. You have vaginal discharge that is abnormal, is yellow in color, or smells bad. This could be a sign of infection. You have severe pain or cramps in your lower abdomen that do not go away with medicine. Summary If you had a colposcopy without a biopsy, you can expect to feel fine right away, but you may have some spotting of blood for a few days. You can return to your normal activities. If you had a colposcopy with a biopsy, it is common to have mild pain for a few days and spotting for 48 hours after the procedure. Avoid using douche products, using tampons, and having sex for at least 3 days after the procedure or for as long as told by your health care provider. Get help right away if you have heavy bleeding, severe pain, or signs of infection. This information is not intended to replace advice given to you by your health care provider. Make sure you discuss any questions you have with your health care provider. Document Revised: 03/22/2021 Document Reviewed: 03/22/2021 Elsevier Patient Education  2024 [employer].

## 2024-07-04 NOTE — Addendum Note (Signed)
 Addended by: SANNA GONG A on: 07/04/2024 04:40 PM   Modules accepted: Orders

## 2024-07-04 NOTE — Progress Notes (Signed)
   COLPOSCOPY PROCEDURE NOTE Patient name: Rebekah Ray MRN [id number]  Date of birth: [birth year] Subjective Findings:   Rebekah Ray is a 68 y.o. G2P0 Caucasian female being seen today for a colposcopy. 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 Patient is postmenopausal. Contraception: post menopausal status and tubal ligation. Menopausal: yes. Hysterectomy: no.   Considering pregnancy: No  Smoker: no. Immunocompromised: no.   The risks and benefits were explained and informed consent was obtained, and written copy is in chart. Pertinent History Reviewed:   Reviewed past medical,surgical, social, obstetrical and family history.  Reviewed problem list, medications and allergies. Objective Findings & Procedure:   Vitals:   07/04/24 1452  BP: 112/73  Pulse: 83  Weight: 198 lb 6.4 oz (90 kg)  Height: 4' 11 (1.499 m)  Body mass index is 40.07 kg/m.  No results found for this or any previous visit (from the past 24 hours).   Time out was performed.  Speculum placed in the vagina, cervix fully visualized. SCJ: not fully visualized. Cervix swabbed x 3 with acetic acid.  Acetowhitening present: Yes Cervix: no visible lesions, no mosaicism, no punctation, no abnormal vasculature, and acetowhite lesion(s) noted at 7 o'clock. Endocervical curettage performed- unable to pass instruments through os adequately d/t atrophy/?stenosis, Cervical biopsies taken at 7 o'clock, and Hemostasis achieved with Monsel's solution. Vagina: vaginal colposcopy not performed Vulva: vulvar colposcopy not performed  Specimens: 2  Complications: none  Chaperone: Winton Cherry  Colposcopic Impression & Plan:   Colposcopy findings consistent with LSIL Plan: Post biopsy instructions given, Will notify patient of results when back, and Will base plan of care on pathology results and [facility]  guidelines  Return in about 1 year (around 07/04/2025) for Pap & physical.  Suzen JONELLE Fetters CNM, [facility] 07/04/2024 3:33 PM

## 2024-07-05 DIAGNOSIS — M5416 Radiculopathy, lumbar region: Secondary | ICD-10-CM | POA: Diagnosis not present

## 2024-07-05 DIAGNOSIS — M62551 Muscle wasting and atrophy, not elsewhere classified, right thigh: Secondary | ICD-10-CM | POA: Diagnosis not present

## 2024-07-05 DIAGNOSIS — R262 Difficulty in walking, not elsewhere classified: Secondary | ICD-10-CM | POA: Diagnosis not present

## 2024-07-06 LAB — SURGICAL PATHOLOGY

## 2024-07-10 ENCOUNTER — Ambulatory Visit: Payer: Self-pay | Admitting: Women's Health

## 2024-07-10 DIAGNOSIS — M5416 Radiculopathy, lumbar region: Secondary | ICD-10-CM | POA: Diagnosis not present

## 2024-07-10 DIAGNOSIS — M62551 Muscle wasting and atrophy, not elsewhere classified, right thigh: Secondary | ICD-10-CM | POA: Diagnosis not present

## 2024-07-10 DIAGNOSIS — R262 Difficulty in walking, not elsewhere classified: Secondary | ICD-10-CM | POA: Diagnosis not present

## 2024-07-12 DIAGNOSIS — M62551 Muscle wasting and atrophy, not elsewhere classified, right thigh: Secondary | ICD-10-CM | POA: Diagnosis not present

## 2024-07-12 DIAGNOSIS — R262 Difficulty in walking, not elsewhere classified: Secondary | ICD-10-CM | POA: Diagnosis not present

## 2024-07-12 DIAGNOSIS — M5416 Radiculopathy, lumbar region: Secondary | ICD-10-CM | POA: Diagnosis not present

## 2024-07-17 DIAGNOSIS — M5416 Radiculopathy, lumbar region: Secondary | ICD-10-CM | POA: Diagnosis not present

## 2024-07-17 DIAGNOSIS — R262 Difficulty in walking, not elsewhere classified: Secondary | ICD-10-CM | POA: Diagnosis not present

## 2024-07-17 DIAGNOSIS — M62551 Muscle wasting and atrophy, not elsewhere classified, right thigh: Secondary | ICD-10-CM | POA: Diagnosis not present

## 2024-07-19 DIAGNOSIS — R262 Difficulty in walking, not elsewhere classified: Secondary | ICD-10-CM | POA: Diagnosis not present

## 2024-07-19 DIAGNOSIS — M5416 Radiculopathy, lumbar region: Secondary | ICD-10-CM | POA: Diagnosis not present

## 2024-07-19 DIAGNOSIS — M62551 Muscle wasting and atrophy, not elsewhere classified, right thigh: Secondary | ICD-10-CM | POA: Diagnosis not present

## 2024-07-24 DIAGNOSIS — R262 Difficulty in walking, not elsewhere classified: Secondary | ICD-10-CM | POA: Diagnosis not present

## 2024-07-24 DIAGNOSIS — M62551 Muscle wasting and atrophy, not elsewhere classified, right thigh: Secondary | ICD-10-CM | POA: Diagnosis not present

## 2024-07-24 DIAGNOSIS — M5416 Radiculopathy, lumbar region: Secondary | ICD-10-CM | POA: Diagnosis not present

## 2024-07-25 DIAGNOSIS — M47816 Spondylosis without myelopathy or radiculopathy, lumbar region: Secondary | ICD-10-CM | POA: Diagnosis not present

## 2024-07-27 DIAGNOSIS — M5416 Radiculopathy, lumbar region: Secondary | ICD-10-CM | POA: Diagnosis not present

## 2024-07-27 DIAGNOSIS — R262 Difficulty in walking, not elsewhere classified: Secondary | ICD-10-CM | POA: Diagnosis not present

## 2024-07-27 DIAGNOSIS — M62551 Muscle wasting and atrophy, not elsewhere classified, right thigh: Secondary | ICD-10-CM | POA: Diagnosis not present

## 2024-08-11 DIAGNOSIS — E119 Type 2 diabetes mellitus without complications: Secondary | ICD-10-CM | POA: Diagnosis not present

## 2024-08-13 DIAGNOSIS — Z6841 Body Mass Index (BMI) 40.0 and over, adult: Secondary | ICD-10-CM | POA: Diagnosis not present

## 2024-08-13 DIAGNOSIS — E1122 Type 2 diabetes mellitus with diabetic chronic kidney disease: Secondary | ICD-10-CM | POA: Diagnosis not present

## 2024-08-13 DIAGNOSIS — N184 Chronic kidney disease, stage 4 (severe): Secondary | ICD-10-CM | POA: Diagnosis not present

## 2024-08-13 DIAGNOSIS — I10 Essential (primary) hypertension: Secondary | ICD-10-CM | POA: Diagnosis not present

## 2024-08-13 DIAGNOSIS — E119 Type 2 diabetes mellitus without complications: Secondary | ICD-10-CM | POA: Diagnosis not present

## 2024-08-13 DIAGNOSIS — I129 Hypertensive chronic kidney disease with stage 1 through stage 4 chronic kidney disease, or unspecified chronic kidney disease: Secondary | ICD-10-CM | POA: Diagnosis not present

## 2024-08-13 DIAGNOSIS — N390 Urinary tract infection, site not specified: Secondary | ICD-10-CM | POA: Diagnosis not present

## 2024-08-20 DIAGNOSIS — M542 Cervicalgia: Secondary | ICD-10-CM | POA: Diagnosis not present

## 2024-08-20 DIAGNOSIS — M47816 Spondylosis without myelopathy or radiculopathy, lumbar region: Secondary | ICD-10-CM | POA: Diagnosis not present

## 2024-09-04 DIAGNOSIS — I5032 Chronic diastolic (congestive) heart failure: Secondary | ICD-10-CM | POA: Diagnosis not present

## 2024-09-04 DIAGNOSIS — E039 Hypothyroidism, unspecified: Secondary | ICD-10-CM | POA: Diagnosis not present

## 2024-09-04 DIAGNOSIS — I10 Essential (primary) hypertension: Secondary | ICD-10-CM | POA: Diagnosis not present

## 2024-09-04 DIAGNOSIS — N184 Chronic kidney disease, stage 4 (severe): Secondary | ICD-10-CM | POA: Diagnosis not present

## 2024-09-04 DIAGNOSIS — J449 Chronic obstructive pulmonary disease, unspecified: Secondary | ICD-10-CM | POA: Diagnosis not present

## 2024-09-04 DIAGNOSIS — E1122 Type 2 diabetes mellitus with diabetic chronic kidney disease: Secondary | ICD-10-CM | POA: Diagnosis not present

## 2024-09-04 DIAGNOSIS — R251 Tremor, unspecified: Secondary | ICD-10-CM | POA: Diagnosis not present

## 2024-11-19 ENCOUNTER — Other Ambulatory Visit (HOSPITAL_COMMUNITY): Payer: Self-pay | Admitting: Nephrology

## 2024-11-19 ENCOUNTER — Encounter: Payer: Self-pay | Admitting: Nephrology

## 2024-11-19 DIAGNOSIS — D509 Iron deficiency anemia, unspecified: Secondary | ICD-10-CM | POA: Insufficient documentation

## 2024-11-19 DIAGNOSIS — D631 Anemia in chronic kidney disease: Secondary | ICD-10-CM | POA: Insufficient documentation

## 2024-11-19 NOTE — Progress Notes (Signed)
 Referral for Venofer 300mg  IV x 1 received from [facility]  Referral to [facility] placed  Larry Knipp, PharmD, MPH, BCPS, CPP Clinical Pharmacist

## 2024-11-20 ENCOUNTER — Telehealth: Payer: Self-pay

## 2024-11-20 NOTE — Telephone Encounter (Signed)
 Auth Submission: NO AUTH NEEDED Site of care: Site of care: [facility] Payer: [employer] Medication & CPT/J Code(s) submitted: Venofer (Iron Sucrose) J1756 Diagnosis Code:  Route of submission (phone, fax, portal): portal Phone # Fax # Auth type: Buy/Bill PB Units/visits requested: 300mg  x 1 dose Reference number:  Approval from: 11/20/24 to 11/07/25

## 2024-11-28 ENCOUNTER — Encounter: Attending: Nephrology | Admitting: Emergency Medicine

## 2024-11-28 VITALS — BP 142/69 | HR 69 | Temp 97.7°F | Resp 15

## 2024-11-28 DIAGNOSIS — D509 Iron deficiency anemia, unspecified: Secondary | ICD-10-CM

## 2024-11-28 DIAGNOSIS — D631 Anemia in chronic kidney disease: Secondary | ICD-10-CM

## 2024-11-28 MED ORDER — SODIUM CHLORIDE 0.9 % IV SOLN
300.0000 mg | Freq: Once | INTRAVENOUS | Status: AC
  Administered 2024-11-28: 300 mg via INTRAVENOUS
  Filled 2024-11-28: qty 5

## 2024-11-28 MED ORDER — ACETAMINOPHEN 325 MG PO TABS
650.0000 mg | ORAL_TABLET | Freq: Once | ORAL | Status: AC
  Administered 2024-11-28: 650 mg via ORAL

## 2024-11-28 MED ORDER — LORATADINE 10 MG PO TABS
10.0000 mg | ORAL_TABLET | Freq: Once | ORAL | Status: AC
  Administered 2024-11-28: 10 mg via ORAL

## 2024-11-28 NOTE — Progress Notes (Signed)
 Diagnosis: Iron  Deficiency Anemia  Provider:  Lynwood LELON Farr MD  Procedure: IV Infusion  IV Type: Peripheral, IV Location: L Antecubital  Venofer  (Iron  Sucrose), Dose: 300 mg  Infusion Start Time: 1318  Infusion Stop Time: 1452  Post Infusion IV Care: Observation period completed and Peripheral IV Discontinued  Discharge: Condition: Good, Destination: Home . AVS Declined  Performed by:  Delon ONEIDA Officer, RN

## 2025-06-27 ENCOUNTER — Ambulatory Visit: Admitting: Adult Health
